# Patient Record
Sex: Male | Born: 1985 | Race: White | Hispanic: No | Marital: Single | Smoking: Current every day smoker
Health system: Southern US, Community
[De-identification: ages and names within clinical notes are randomized; demographics above are authoritative.]

## PROBLEM LIST (undated history)

## (undated) DIAGNOSIS — N451 Epididymitis: Secondary | ICD-10-CM

## (undated) DIAGNOSIS — I219 Acute myocardial infarction, unspecified: Secondary | ICD-10-CM

## (undated) DIAGNOSIS — Z8711 Personal history of peptic ulcer disease: Secondary | ICD-10-CM

## (undated) DIAGNOSIS — N200 Calculus of kidney: Secondary | ICD-10-CM

## (undated) DIAGNOSIS — Z8719 Personal history of other diseases of the digestive system: Secondary | ICD-10-CM

## (undated) DIAGNOSIS — I10 Essential (primary) hypertension: Secondary | ICD-10-CM

## (undated) DIAGNOSIS — F419 Anxiety disorder, unspecified: Secondary | ICD-10-CM

## (undated) DIAGNOSIS — J189 Pneumonia, unspecified organism: Secondary | ICD-10-CM

## (undated) HISTORY — DX: Essential (primary) hypertension: I10

## (undated) HISTORY — DX: Anxiety disorder, unspecified: F41.9

## (undated) HISTORY — DX: Calculus of kidney: N20.0

---

## 2005-06-02 ENCOUNTER — Ambulatory Visit (HOSPITAL_COMMUNITY): Admission: RE | Admit: 2005-06-02 | Discharge: 2005-06-02 | Payer: Self-pay | Admitting: Orthopedic Surgery

## 2005-07-28 ENCOUNTER — Encounter: Admission: RE | Admit: 2005-07-28 | Discharge: 2005-07-28 | Payer: Self-pay | Admitting: Family Medicine

## 2005-08-17 ENCOUNTER — Emergency Department (HOSPITAL_COMMUNITY): Admission: AC | Admit: 2005-08-17 | Discharge: 2005-08-18 | Payer: Self-pay

## 2006-02-08 ENCOUNTER — Emergency Department (HOSPITAL_COMMUNITY): Admission: EM | Admit: 2006-02-08 | Discharge: 2006-02-08 | Payer: Self-pay | Admitting: Emergency Medicine

## 2007-02-02 ENCOUNTER — Encounter: Admission: RE | Admit: 2007-02-02 | Discharge: 2007-02-02 | Payer: Self-pay | Admitting: Emergency Medicine

## 2007-03-13 ENCOUNTER — Encounter: Admission: RE | Admit: 2007-03-13 | Discharge: 2007-03-13 | Payer: Self-pay | Admitting: Family Medicine

## 2008-01-13 ENCOUNTER — Emergency Department (HOSPITAL_COMMUNITY): Admission: EM | Admit: 2008-01-13 | Discharge: 2008-01-14 | Payer: Self-pay | Admitting: Emergency Medicine

## 2008-04-23 ENCOUNTER — Emergency Department (HOSPITAL_COMMUNITY): Admission: EM | Admit: 2008-04-23 | Discharge: 2008-04-23 | Payer: Self-pay | Admitting: Emergency Medicine

## 2008-06-23 ENCOUNTER — Emergency Department (HOSPITAL_BASED_OUTPATIENT_CLINIC_OR_DEPARTMENT_OTHER): Admission: EM | Admit: 2008-06-23 | Discharge: 2008-06-23 | Payer: Self-pay | Admitting: Emergency Medicine

## 2008-08-14 ENCOUNTER — Emergency Department (HOSPITAL_BASED_OUTPATIENT_CLINIC_OR_DEPARTMENT_OTHER): Admission: EM | Admit: 2008-08-14 | Discharge: 2008-08-14 | Payer: Self-pay | Admitting: Emergency Medicine

## 2008-09-06 ENCOUNTER — Emergency Department (HOSPITAL_BASED_OUTPATIENT_CLINIC_OR_DEPARTMENT_OTHER): Admission: EM | Admit: 2008-09-06 | Discharge: 2008-09-06 | Payer: Self-pay | Admitting: Emergency Medicine

## 2008-09-06 ENCOUNTER — Ambulatory Visit: Payer: Self-pay | Admitting: Diagnostic Radiology

## 2008-10-19 ENCOUNTER — Emergency Department (HOSPITAL_BASED_OUTPATIENT_CLINIC_OR_DEPARTMENT_OTHER): Admission: EM | Admit: 2008-10-19 | Discharge: 2008-10-19 | Payer: Self-pay | Admitting: Emergency Medicine

## 2008-10-19 ENCOUNTER — Ambulatory Visit: Payer: Self-pay | Admitting: Diagnostic Radiology

## 2008-12-25 ENCOUNTER — Emergency Department (HOSPITAL_BASED_OUTPATIENT_CLINIC_OR_DEPARTMENT_OTHER): Admission: EM | Admit: 2008-12-25 | Discharge: 2008-12-25 | Payer: Self-pay | Admitting: Emergency Medicine

## 2008-12-30 ENCOUNTER — Emergency Department (HOSPITAL_BASED_OUTPATIENT_CLINIC_OR_DEPARTMENT_OTHER): Admission: EM | Admit: 2008-12-30 | Discharge: 2008-12-30 | Payer: Self-pay | Admitting: Emergency Medicine

## 2008-12-30 ENCOUNTER — Ambulatory Visit: Payer: Self-pay | Admitting: Diagnostic Radiology

## 2009-02-21 ENCOUNTER — Ambulatory Visit: Payer: Self-pay | Admitting: Radiology

## 2009-02-21 ENCOUNTER — Emergency Department (HOSPITAL_BASED_OUTPATIENT_CLINIC_OR_DEPARTMENT_OTHER): Admission: EM | Admit: 2009-02-21 | Discharge: 2009-02-21 | Payer: Self-pay | Admitting: Emergency Medicine

## 2009-03-08 ENCOUNTER — Ambulatory Visit: Payer: Self-pay | Admitting: Diagnostic Radiology

## 2009-03-08 ENCOUNTER — Emergency Department (HOSPITAL_BASED_OUTPATIENT_CLINIC_OR_DEPARTMENT_OTHER): Admission: EM | Admit: 2009-03-08 | Discharge: 2009-03-08 | Payer: Self-pay | Admitting: Emergency Medicine

## 2009-04-01 ENCOUNTER — Emergency Department (HOSPITAL_BASED_OUTPATIENT_CLINIC_OR_DEPARTMENT_OTHER): Admission: EM | Admit: 2009-04-01 | Discharge: 2009-04-01 | Payer: Self-pay | Admitting: Emergency Medicine

## 2009-04-10 ENCOUNTER — Emergency Department (HOSPITAL_BASED_OUTPATIENT_CLINIC_OR_DEPARTMENT_OTHER): Admission: EM | Admit: 2009-04-10 | Discharge: 2009-04-10 | Payer: Self-pay | Admitting: Emergency Medicine
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
## 2009-05-02 ENCOUNTER — Emergency Department (HOSPITAL_BASED_OUTPATIENT_CLINIC_OR_DEPARTMENT_OTHER): Admission: EM | Admit: 2009-05-02 | Discharge: 2009-05-02 | Payer: Self-pay | Admitting: Emergency Medicine

## 2009-05-20 ENCOUNTER — Encounter
Admission: RE | Admit: 2009-05-20 | Discharge: 2009-06-25 | Payer: Self-pay | Admitting: Physical Medicine and Rehabilitation

## 2009-10-03 HISTORY — PX: COLONOSCOPY: SHX174

## 2009-10-03 HISTORY — PX: LITHOTRIPSY: SUR834

## 2009-12-29 ENCOUNTER — Emergency Department (HOSPITAL_BASED_OUTPATIENT_CLINIC_OR_DEPARTMENT_OTHER): Admission: EM | Admit: 2009-12-29 | Discharge: 2009-12-29 | Payer: Self-pay | Admitting: Emergency Medicine

## 2009-12-29 ENCOUNTER — Ambulatory Visit: Payer: Self-pay | Admitting: Diagnostic Radiology

## 2010-01-02 ENCOUNTER — Ambulatory Visit: Payer: Self-pay | Admitting: Diagnostic Radiology

## 2010-01-02 ENCOUNTER — Emergency Department (HOSPITAL_BASED_OUTPATIENT_CLINIC_OR_DEPARTMENT_OTHER): Admission: EM | Admit: 2010-01-02 | Discharge: 2010-01-02 | Payer: Self-pay | Admitting: Emergency Medicine

## 2010-01-06 ENCOUNTER — Emergency Department (HOSPITAL_BASED_OUTPATIENT_CLINIC_OR_DEPARTMENT_OTHER): Admission: EM | Admit: 2010-01-06 | Discharge: 2010-01-06 | Payer: Self-pay | Admitting: Emergency Medicine

## 2010-02-24 ENCOUNTER — Ambulatory Visit: Payer: Self-pay | Admitting: Diagnostic Radiology

## 2010-02-24 ENCOUNTER — Emergency Department (HOSPITAL_BASED_OUTPATIENT_CLINIC_OR_DEPARTMENT_OTHER): Admission: EM | Admit: 2010-02-24 | Discharge: 2010-02-24 | Payer: Self-pay | Admitting: Emergency Medicine

## 2010-03-06 ENCOUNTER — Emergency Department (HOSPITAL_COMMUNITY): Admission: EM | Admit: 2010-03-06 | Discharge: 2010-03-06 | Payer: Self-pay | Admitting: Emergency Medicine

## 2010-05-07 ENCOUNTER — Emergency Department (HOSPITAL_COMMUNITY): Admission: EM | Admit: 2010-05-07 | Discharge: 2010-05-07 | Payer: Self-pay | Admitting: Emergency Medicine

## 2010-05-22 ENCOUNTER — Inpatient Hospital Stay (HOSPITAL_COMMUNITY): Admission: EM | Admit: 2010-05-22 | Discharge: 2010-05-25 | Payer: Self-pay | Admitting: Emergency Medicine

## 2010-12-11 ENCOUNTER — Emergency Department (HOSPITAL_COMMUNITY): Payer: PRIVATE HEALTH INSURANCE

## 2010-12-11 ENCOUNTER — Emergency Department (HOSPITAL_COMMUNITY)
Admission: EM | Admit: 2010-12-11 | Discharge: 2010-12-11 | Disposition: A | Discharge status: Home / Self Care | Payer: PRIVATE HEALTH INSURANCE | Attending: Emergency Medicine | Admitting: Emergency Medicine

## 2010-12-11 DIAGNOSIS — W1809XA Striking against other object with subsequent fall, initial encounter: Secondary | ICD-10-CM | POA: Insufficient documentation

## 2010-12-11 DIAGNOSIS — M549 Dorsalgia, unspecified: Secondary | ICD-10-CM | POA: Insufficient documentation

## 2010-12-11 DIAGNOSIS — Y9364 Activity, baseball: Secondary | ICD-10-CM | POA: Insufficient documentation

## 2010-12-11 DIAGNOSIS — R319 Hematuria, unspecified: Secondary | ICD-10-CM | POA: Insufficient documentation

## 2010-12-11 DIAGNOSIS — S93409A Sprain of unspecified ligament of unspecified ankle, initial encounter: Secondary | ICD-10-CM | POA: Insufficient documentation

## 2010-12-11 DIAGNOSIS — IMO0002 Reserved for concepts with insufficient information to code with codable children: Secondary | ICD-10-CM | POA: Insufficient documentation

## 2010-12-11 DIAGNOSIS — Y92838 Other recreation area as the place of occurrence of the external cause: Secondary | ICD-10-CM | POA: Insufficient documentation

## 2010-12-11 DIAGNOSIS — Y9239 Other specified sports and athletic area as the place of occurrence of the external cause: Secondary | ICD-10-CM | POA: Insufficient documentation

## 2010-12-11 LAB — URINE MICROSCOPIC-ADD ON

## 2010-12-11 LAB — URINALYSIS, ROUTINE W REFLEX MICROSCOPIC
Bilirubin Urine: NEGATIVE
Ketones, ur: NEGATIVE mg/dL
Protein, ur: >300 mg/dL — AB
Urobilinogen, UA: 1 mg/dL (ref 0.0–1.0)

## 2010-12-17 LAB — BASIC METABOLIC PANEL
BUN: 9 mg/dL (ref 6–23)
Calcium: 8.4 mg/dL (ref 8.4–10.5)
Creatinine, Ser: 0.92 mg/dL (ref 0.4–1.5)
GFR calc non Af Amer: >60 mL/min (ref >60)
Glucose, Bld: 82 mg/dL (ref 70–99)
Potassium: 3.9 mEq/L (ref 3.5–5.1)

## 2010-12-17 LAB — URINALYSIS, ROUTINE W REFLEX MICROSCOPIC
Bilirubin Urine: NEGATIVE
Glucose, UA: NEGATIVE mg/dL
Glucose, UA: NEGATIVE mg/dL
Ketones, ur: 40 mg/dL — AB
Leukocytes, UA: NEGATIVE
Nitrite: NEGATIVE
Nitrite: NEGATIVE
Specific Gravity, Urine: 1.026 (ref 1.005–1.030)
Urobilinogen, UA: 0.2 mg/dL (ref 0.0–1.0)
pH: 6 (ref 5.0–8.0)
pH: 6 (ref 5.0–8.0)

## 2010-12-17 LAB — LIPASE, BLOOD: Lipase: 25 U/L (ref 11–59)

## 2010-12-17 LAB — DIFFERENTIAL
Eosinophils Absolute: 0.1 10*3/uL (ref 0.0–0.7)
Eosinophils Relative: 0 % (ref 0–5)
Eosinophils Relative: 1 % (ref 0–5)
Lymphocytes Relative: 4 % — ABNORMAL LOW (ref 12–46)
Lymphs Abs: 0.8 10*3/uL (ref 0.7–4.0)
Lymphs Abs: 1.8 10*3/uL (ref 0.7–4.0)
Monocytes Absolute: 1 10*3/uL (ref 0.1–1.0)
Monocytes Absolute: 0.3 10*3/uL (ref 0.1–1.0)
Monocytes Relative: 4 % (ref 3–12)
Neutro Abs: 17.8 10*3/uL — ABNORMAL HIGH (ref 1.7–7.7)
Neutrophils Relative %: 74 % (ref 43–77)

## 2010-12-17 LAB — AMYLASE: Amylase: 39 U/L (ref 0–105)

## 2010-12-17 LAB — CBC
HCT: 34.5 % — ABNORMAL LOW (ref 39.0–52.0)
HCT: 45.5 % (ref 39.0–52.0)
Hemoglobin: 12.6 g/dL — ABNORMAL LOW (ref 13.0–17.0)
MCHC: 34.9 g/dL (ref 30.0–36.0)
MCHC: 35 g/dL (ref 30.0–36.0)
MCV: 93.6 fL (ref 78.0–100.0)
MCV: 94.5 fL (ref 78.0–100.0)
Platelets: 193 10*3/uL (ref 150–400)
Platelets: 212 10*3/uL (ref 150–400)
Platelets: 259 10*3/uL (ref 150–400)
Platelets: 266 10*3/uL (ref 150–400)
RBC: 3.88 MIL/uL — ABNORMAL LOW (ref 4.22–5.81)
RBC: 4.86 MIL/uL (ref 4.22–5.81)
RDW: 12.2 % (ref 11.5–15.5)
RDW: 12.8 % (ref 11.5–15.5)
WBC: 11.8 10*3/uL — ABNORMAL HIGH (ref 4.0–10.5)
WBC: 19.7 10*3/uL — ABNORMAL HIGH (ref 4.0–10.5)
WBC: 6.8 10*3/uL (ref 4.0–10.5)

## 2010-12-17 LAB — RAPID URINE DRUG SCREEN, HOSP PERFORMED
Amphetamines: NOT DETECTED
Cocaine: NOT DETECTED
Opiates: POSITIVE — AB
Tetrahydrocannabinol: POSITIVE — AB

## 2010-12-17 LAB — STOOL CULTURE

## 2010-12-17 LAB — COMPREHENSIVE METABOLIC PANEL
ALT: 15 U/L (ref 0–53)
AST: 23 U/L (ref 0–37)
Albumin: 3.9 g/dL (ref 3.5–5.2)
Calcium: 8.9 mg/dL (ref 8.4–10.5)
GFR calc Af Amer: >60 mL/min (ref >60)
Sodium: 135 mEq/L (ref 135–145)
Total Protein: 6.8 g/dL (ref 6.0–8.3)

## 2010-12-17 LAB — URINE CULTURE
Colony Count: NO GROWTH
Culture  Setup Time: 201108211323
Culture: NO GROWTH
Special Requests: NEGATIVE

## 2010-12-17 LAB — URINE MICROSCOPIC-ADD ON

## 2010-12-17 LAB — LACTIC ACID, PLASMA: Lactic Acid, Venous: 0.9 mmol/L (ref 0.5–2.2)

## 2010-12-17 LAB — POCT I-STAT, CHEM 8
BUN: 15 mg/dL (ref 6–23)
Chloride: 102 mEq/L (ref 96–112)
Creatinine, Ser: 0.9 mg/dL (ref 0.4–1.5)
Sodium: 136 mEq/L (ref 135–145)
TCO2: 26 mmol/L (ref 0–100)

## 2010-12-17 LAB — CLOSTRIDIUM DIFFICILE EIA: C difficile Toxins A+B, EIA: NEGATIVE

## 2010-12-17 LAB — HEPATIC FUNCTION PANEL
Albumin: 2.8 g/dL — ABNORMAL LOW (ref 3.5–5.2)
Total Bilirubin: 0.5 mg/dL (ref 0.3–1.2)

## 2010-12-20 LAB — POCT I-STAT, CHEM 8
BUN: 10 mg/dL (ref 6–23)
Creatinine, Ser: 0.8 mg/dL (ref 0.4–1.5)
Sodium: 142 mEq/L (ref 135–145)
TCO2: 27 mmol/L (ref 0–100)

## 2010-12-22 LAB — URINALYSIS, ROUTINE W REFLEX MICROSCOPIC
Glucose, UA: NEGATIVE mg/dL
Protein, ur: 30 mg/dL — AB

## 2010-12-22 LAB — URINE MICROSCOPIC-ADD ON

## 2010-12-26 LAB — URINALYSIS, ROUTINE W REFLEX MICROSCOPIC
Ketones, ur: 15 mg/dL — AB
Nitrite: NEGATIVE
Protein, ur: NEGATIVE mg/dL
Urobilinogen, UA: 1 mg/dL (ref 0.0–1.0)
pH: 6.5 (ref 5.0–8.0)

## 2011-01-27 ENCOUNTER — Ambulatory Visit (INDEPENDENT_AMBULATORY_CARE_PROVIDER_SITE_OTHER): Payer: PRIVATE HEALTH INSURANCE | Admitting: Family Medicine

## 2011-01-27 ENCOUNTER — Encounter: Payer: Self-pay | Admitting: Family Medicine

## 2011-01-27 DIAGNOSIS — F411 Generalized anxiety disorder: Secondary | ICD-10-CM

## 2011-01-27 DIAGNOSIS — Z136 Encounter for screening for cardiovascular disorders: Secondary | ICD-10-CM

## 2011-01-27 DIAGNOSIS — I10 Essential (primary) hypertension: Secondary | ICD-10-CM | POA: Insufficient documentation

## 2011-01-27 DIAGNOSIS — F419 Anxiety disorder, unspecified: Secondary | ICD-10-CM | POA: Insufficient documentation

## 2011-01-27 DIAGNOSIS — K5190 Ulcerative colitis, unspecified, without complications: Secondary | ICD-10-CM | POA: Insufficient documentation

## 2011-01-27 LAB — LIPID PANEL
Total CHOL/HDL Ratio: 2
VLDL: 14.8 mg/dL (ref 0.0–40.0)

## 2011-01-27 LAB — BASIC METABOLIC PANEL
BUN: 12 mg/dL (ref 6–23)
CO2: 29 mEq/L (ref 19–32)
Chloride: 104 mEq/L (ref 96–112)
Glucose, Bld: 71 mg/dL (ref 70–99)
Potassium: 4.6 mEq/L (ref 3.5–5.1)

## 2011-01-27 MED ORDER — MIRTAZAPINE 7.5 MG PO TABS: 7.5000 mg | ORAL_TABLET | Freq: Every day | ORAL | Status: AC

## 2011-01-27 MED ORDER — CLONIDINE HCL 0.1 MG PO TABS: 0.1000 mg | ORAL_TABLET | Freq: Every day | ORAL | Status: DC

## 2011-01-27 NOTE — Assessment & Plan Note (Signed)
Deteriorated. Will restart Clonidine. Check BMET today. Follow up in 1 month. Discussed importance of NOT discontinuing this medication.

## 2011-01-27 NOTE — Assessment & Plan Note (Signed)
Deteriorated. Will add Remeron 7.5 mg nightly. Follow up in one month.

## 2011-01-27 NOTE — Progress Notes (Signed)
25 yo here male here to establish care.  HTN- was on clonidine for years but lost his insurance.  Clonidine was chosen because it helped with his anxiety and OCD symptoms. Per pt, was on on clonidine .1 mg twice daily but became hypotensive.   Pt denies any CP, SOB, or blurred vision. Pos FH of HTN in mother, does not know his father.  Anxiety/depression- in a custody battle with his ex girlfriend for their 76 year old daughter. Has to pretend to be happy at work but at home becomes tearful when he thinks of his daughter, has hard time falling asleep because mind is racing. Has lost 5 pounds because of decreased appetite. No panic attacks. No SI or HI. In past was on Zoloft and Celexa which made him feel "like a Zombie." Does have a therapist that he sees on occasion.  The PMH, PSH, Social History, Family History, Medications, and allergies have been reviewed in [facility], and have been updated if relevant.  ROS: As per HPI General: Denies fever, chills, sweats. Pos weight loss. Eyes: Denies blurring,significant itching ENT: Denies earache, sore throat, and hoarseness.  Cardiovascular: Denies chest pains, palpitations, dyspnea on exertion,  Respiratory: Denies cough, dyspnea at rest,wheeezing GI: Denies nausea, vomiting, diarrhea, constipation, change in bowel habits, abdominal pain, melena, hematochezia GU: Denies dysuria, hematuria, urinary hesitancy, nocturia, denies STD risk, no concerns about discharge Musculoskeletal: Denies back pain, joint pain Derm: Denies rash, itching Neuro: Denies  paresthesias, frequent falls, frequent headaches Psych: Denies SI or HI. Endocrine: Denies cold intolerance, heat intolerance, polydipsia Heme: Denies enlarged lymph nodes Allergy: No hayfever  Physical exam: BP 140/80  Pulse 88  Temp(Src) 98.2 F (36.8 C) (Oral)  Ht 5' 7"  (1.702 m)  Wt 115 lb 1.9 oz (52.218 kg)  BMI 18.03 kg/m2 General:  Alert, thin male, NAD. Eyes:  PERRL Ears:   External ear exam shows no significant lesions or deformities.  Otoscopic examination reveals clear canals, tympanic membranes are intact bilaterally without bulging, retraction, inflammation or discharge. Hearing is grossly normal bilaterally. Nose:  External nasal examination shows no deformity or inflammation. Nasal mucosa are pink and moist without lesions or exudates. Mouth:  Oral mucosa and oropharynx without lesions or exudates.  Teeth in good repair. Neck:  no carotid bruit or thyromegaly no cervical or supraclavicular lymphadenopathy  Lungs:  Normal respiratory effort, chest expands symmetrically. Lungs are clear to auscultation, no crackles or wheezes. Heart:  Normal rate and regular rhythm. S1 and S2 normal without gallop, murmur, click, rub or other extra sounds. Abdomen:  Bowel sounds positive,abdomen soft and non-tender without masses, organomegaly or hernias noted. Pulses:  R and L posterior tibial pulses are full and equal bilaterally  Extremities:  no edema  Psych:  Good eye contact, appropriate but moderately anxious

## 2011-01-27 NOTE — Patient Instructions (Signed)
Great to meet you. Please make an appointment to come see me in one month. 

## 2011-02-15 ENCOUNTER — Encounter: Payer: Self-pay | Admitting: Family Medicine

## 2011-03-01 ENCOUNTER — Ambulatory Visit (INDEPENDENT_AMBULATORY_CARE_PROVIDER_SITE_OTHER): Payer: PRIVATE HEALTH INSURANCE | Admitting: Family Medicine

## 2011-03-01 VITALS — BP 110/80 | HR 77 | Temp 98.6°F | Wt 116.0 lb

## 2011-03-01 DIAGNOSIS — F411 Generalized anxiety disorder: Secondary | ICD-10-CM

## 2011-03-01 DIAGNOSIS — F419 Anxiety disorder, unspecified: Secondary | ICD-10-CM

## 2011-03-01 DIAGNOSIS — I10 Essential (primary) hypertension: Secondary | ICD-10-CM

## 2011-03-01 MED ORDER — BUSPIRONE HCL 15 MG PO TABS: 15.0000 mg | ORAL_TABLET | Freq: Two times a day (BID) | ORAL | Status: DC

## 2011-03-01 NOTE — Assessment & Plan Note (Signed)
Improved. Continue clonidine.

## 2011-03-01 NOTE — Assessment & Plan Note (Signed)
Deteriorated. >25 min spent with face to face with patient counseling and coordinating care. Will d/c remeron and start buspar 15 mg twice a day. Given weaning schedule for remeron. Follow up in one month.

## 2011-03-01 NOTE — Progress Notes (Signed)
25 yo here male here for one month follow up.  HTN- was on clonidine for years but lost his insurance.  Clonidine was chosen because it helped with his anxiety and OCD symptoms. Per pt, was on on clonidine .1 mg twice daily but became hypotensive.   Pt denies any CP, SOB, or blurred vision. Pos FH of HTN in mother, does not know his father. Restart Clonidine last month.  BP much improved and he feels better.    Anxiety/depression- in a custody battle with his ex girlfriend for their four year old daughter, court case in on June 7th.   Has to pretend to be happy at work but at home becomes tearful when he thinks of his daughter, has hard time falling asleep because mind is racing. No panic attacks. No SI or HI. In past was on Zoloft, Paxil and Celexa which made him feel "like a Zombie." Does have a therapist that he sees on occasion. Started Remeron 7.5 mg nightly last month.  Improved his symptoms but gave him very vivid dreams.    The PMH, PSH, Social History, Family History, Medications, and allergies have been reviewed in [facility], and have been updated if relevant.  ROS: As per HPI General: Denies fever, chills, sweats.  Eyes: Denies blurring,significant itching Cardiovascular: Denies chest pains, palpitations, dyspnea on exertion,  Respiratory: Denies cough, dyspnea at rest,wheeezing GI: Denies nausea, vomiting, diarrhea, constipation, change in bowel habits, abdominal pain, melena, hematochezia Psych: Denies SI or HI. Endocrine: Denies cold intolerance, heat intolerance, polydipsia Heme: Denies enlarged lymph nodes Allergy: No hayfever  Physical exam: BP 110/80  Pulse 77  Temp 98.6 F (37 C)  Wt 116 lb (52.617 kg) Wt Readings from Last 3 Encounters:  03/01/11 116 lb (52.617 kg)  01/27/11 115 lb 1.9 oz (52.218 kg)   General:  Alert, thin male, NAD. Eyes:  PERRL Ears:  External ear exam shows no significant lesions or deformities.  Otoscopic examination reveals clear canals,  tympanic membranes are intact bilaterally without bulging, retraction, inflammation or discharge. Hearing is grossly normal bilaterally. Nose:  External nasal examination shows no deformity or inflammation. Nasal mucosa are pink and moist without lesions or exudates. Mouth:  Oral mucosa and oropharynx without lesions or exudates.  Teeth in good repair. Neck:  no carotid bruit or thyromegaly no cervical or supraclavicular lymphadenopathy  Lungs:  Normal respiratory effort, chest expands symmetrically. Lungs are clear to auscultation, no crackles or wheezes. Heart:  Normal rate and regular rhythm. S1 and S2 normal without gallop, murmur, click, rub or other extra sounds. Abdomen:  Bowel sounds positive,abdomen soft and non-tender without masses, organomegaly or hernias noted. Pulses:  R and L posterior tibial pulses are full and equal bilaterally  Extremities:  no edema  Psych:  Good eye contact, appropriate but moderately anxious

## 2011-06-28 LAB — CBC
HCT: 40.6
Platelets: 259
RDW: 12.3
WBC: 9

## 2011-06-28 LAB — BASIC METABOLIC PANEL
Chloride: 103
GFR calc Af Amer: >60
GFR calc non Af Amer: >60
Potassium: 3.6
Sodium: 138

## 2011-07-05 LAB — CBC
HCT: 41
MCV: 92.9
Platelets: 356
RBC: 4.42
WBC: 11.5 — ABNORMAL HIGH

## 2011-07-05 LAB — DIFFERENTIAL
Eosinophils Relative: 2
Lymphocytes Relative: 19
Monocytes Absolute: 0.7
Monocytes Relative: 6
Neutro Abs: 8.4 — ABNORMAL HIGH

## 2011-07-05 LAB — BASIC METABOLIC PANEL
BUN: 9
Chloride: 100
GFR calc Af Amer: >60
GFR calc non Af Amer: >60
Potassium: 3.5
Sodium: 141

## 2011-07-05 LAB — POCT TOXICOLOGY PANEL: Amphetamines: POSITIVE

## 2011-07-05 LAB — ETHANOL: Alcohol, Ethyl (B): <10

## 2011-07-05 LAB — POCT CARDIAC MARKERS
CKMB, poc: 1.2
CKMB, poc: <1 — ABNORMAL LOW
Troponin i, poc: <0.05
Troponin i, poc: <0.05

## 2011-07-08 LAB — DIFFERENTIAL
Eosinophils Relative: 3 % (ref 0–5)
Lymphocytes Relative: 29 % (ref 12–46)
Lymphs Abs: 2.7 10*3/uL (ref 0.7–4.0)

## 2011-07-08 LAB — BASIC METABOLIC PANEL
BUN: 15 mg/dL (ref 6–23)
GFR calc Af Amer: >60 mL/min (ref >60)
GFR calc non Af Amer: >60 mL/min (ref >60)
Potassium: 4 mEq/L (ref 3.5–5.1)
Sodium: 142 mEq/L (ref 135–145)

## 2011-07-08 LAB — CBC
HCT: 40.4 % (ref 39.0–52.0)
Platelets: 269 10*3/uL (ref 150–400)
RBC: 4.34 MIL/uL (ref 4.22–5.81)
WBC: 9.5 10*3/uL (ref 4.0–10.5)

## 2011-12-23 ENCOUNTER — Telehealth: Payer: Self-pay | Admitting: Family Medicine

## 2011-12-23 NOTE — Telephone Encounter (Signed)
Agreed -

## 2011-12-23 NOTE — Telephone Encounter (Signed)
Triage Record Num: [id number] Operator: Trinna Balloon Patient Name: Todd Hanna Call Date & Time: 12/23/2011 12:20:45PM Patient Phone: [phone] PCP: Arnette Norris Patient Gender: Male PCP Fax : [phone] Patient DOB: Jan 05, 1986 Practice Name: [facility] Reason for Call: Caller: [place]/Patient; PCP: Arnette Norris [facility]); CB#: [phone]; Call regarding L elbow swelling; L elbow swelling and redness, hot to touch onset 12/22/11. Random tingling to fingers. No known injury. Advised UC now per Elbow Non-Injury Protocol. Protocol(s) Used: Elbow Non-Injury Recommended Outcome per Protocol: See ED Immediately Reason for Outcome: New onset of severe pain AND change in sensation (numb, tingling, or no feeling), change in color (pale or blue), feels cool to touch compared to other extremity. Care Advice: ~ Another adult should drive. ~ Remove any rings on the fingers of the affected hand, if possible. ~ Continue to elevate affected part until evaluated by provider. If extremity has not been elevated, elevate the extremity above the level of the heart for 30 minutes. Go to the ED immediately if the swelling has not improved and there are still symptoms of pain, numbness, tingling, coolness or change in color. ~ 12/23/2011 12:31:29PM Page 1 of 1 [id number]

## 2011-12-23 NOTE — Telephone Encounter (Signed)
Caller: Todd Hanna/Patient; PCP: Arnette Norris (Crissie Sickles); CB#: [phone];  Call regarding L elbow swelling; L elbow swelling and redness, hot to touch onset 12/22/11. Random tingling to fingers. No known injury. Advised UC now per Elbow Non-Injury Protocol.

## 2011-12-30 ENCOUNTER — Other Ambulatory Visit: Payer: Self-pay | Admitting: Family Medicine

## 2012-01-31 ENCOUNTER — Other Ambulatory Visit: Payer: Self-pay | Admitting: Family Medicine

## 2012-02-29 ENCOUNTER — Other Ambulatory Visit: Payer: Self-pay | Admitting: Family Medicine
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
## 2012-03-30 ENCOUNTER — Encounter (HOSPITAL_COMMUNITY): Payer: Self-pay | Admitting: Emergency Medicine

## 2012-03-30 ENCOUNTER — Emergency Department (HOSPITAL_COMMUNITY)
Admission: EM | Admit: 2012-03-30 | Discharge: 2012-03-30 | Disposition: A | Discharge status: Home / Self Care | Payer: Self-pay | Attending: Emergency Medicine | Admitting: Emergency Medicine

## 2012-03-30 ENCOUNTER — Emergency Department (HOSPITAL_COMMUNITY): Payer: Self-pay

## 2012-03-30 DIAGNOSIS — W06XXXA Fall from bed, initial encounter: Secondary | ICD-10-CM | POA: Insufficient documentation

## 2012-03-30 DIAGNOSIS — Z880 Allergy status to penicillin: Secondary | ICD-10-CM | POA: Insufficient documentation

## 2012-03-30 DIAGNOSIS — Z79899 Other long term (current) drug therapy: Secondary | ICD-10-CM | POA: Insufficient documentation

## 2012-03-30 DIAGNOSIS — S93409A Sprain of unspecified ligament of unspecified ankle, initial encounter: Secondary | ICD-10-CM | POA: Insufficient documentation

## 2012-03-30 DIAGNOSIS — Z87442 Personal history of urinary calculi: Secondary | ICD-10-CM | POA: Insufficient documentation

## 2012-03-30 DIAGNOSIS — Y92009 Unspecified place in unspecified non-institutional (private) residence as the place of occurrence of the external cause: Secondary | ICD-10-CM | POA: Insufficient documentation

## 2012-03-30 DIAGNOSIS — S93402A Sprain of unspecified ligament of left ankle, initial encounter: Secondary | ICD-10-CM

## 2012-03-30 DIAGNOSIS — I10 Essential (primary) hypertension: Secondary | ICD-10-CM | POA: Insufficient documentation

## 2012-03-30 DIAGNOSIS — F17200 Nicotine dependence, unspecified, uncomplicated: Secondary | ICD-10-CM | POA: Insufficient documentation

## 2012-03-30 MED ORDER — TRAMADOL HCL 50 MG PO TABS: 50.0000 mg | ORAL_TABLET | Freq: Four times a day (QID) | ORAL | Status: AC | PRN

## 2012-03-30 NOTE — ED Notes (Signed)
C/o L ankle pain x 2 hours.  Pt states he twisted L ankle when he got off bed tonight.

## 2012-03-30 NOTE — Discharge Instructions (Signed)
Ankle Sprain An ankle sprain is an injury to the strong, fibrous tissues (ligaments) that hold the bones of your ankle joint together.  CAUSES Ankle sprain usually is caused by a fall or by twisting your ankle. People who participate in sports are more prone to these types of injuries.  SYMPTOMS  Symptoms of ankle sprain include:  Pain in your ankle. The pain may be present at rest or only when you are trying to stand or walk.   Swelling.   Bruising. Bruising may develop immediately or within 1 to 2 days after your injury.   Difficulty standing or walking.  DIAGNOSIS  Your caregiver will ask you details about your injury and perform a physical exam of your ankle to determine if you have an ankle sprain. During the physical exam, your caregiver will press and squeeze specific areas of your foot and ankle. Your caregiver will try to move your ankle in certain ways. An X-ray exam may be done to be sure a bone was not broken or a ligament did not separate from one of the bones in your ankle (avulsion).  TREATMENT  Certain types of braces can help stabilize your ankle. Your caregiver can make a recommendation for this. Your caregiver may recommend the use of medication for pain. If your sprain is severe, your caregiver may refer you to a surgeon who helps to restore function to parts of your skeletal system (orthopedist) or a physical therapist. [place] ice to your injury for 1 to 2 days or as directed by your caregiver. Applying ice helps to reduce inflammation and pain.  Put ice in a plastic bag.   Place a towel between your skin and the bag.   Leave the ice on for 15 to 20 minutes at a time, every 2 hours while you are awake.   Take over-the-counter or prescription medicines for pain, discomfort, or fever only as directed by your caregiver.   Keep your injured leg elevated, when possible, to lessen swelling.   If your caregiver recommends crutches, use them as  instructed. Gradually, put weight on the affected ankle. Continue to use crutches or a cane until you can walk without feeling pain in your ankle.   If you have a plaster splint, wear the splint as directed by your caregiver. Do not rest it on anything harder than a pillow the first 24 hours. Do not put weight on it. Do not get it wet. You may take it off to take a shower or bath.   You may have been given an elastic bandage to wear around your ankle to provide support. If the elastic bandage is too tight (you have numbness or tingling in your foot or your foot becomes cold and blue), adjust the bandage to make it comfortable.   If you have an air splint, you may blow more air into it or let air out to make it more comfortable. You may take your splint off at night and before taking a shower or bath.   Wiggle your toes in the splint several times per day if you are able.  SEEK MEDICAL CARE IF:   You have an increase in bruising, swelling, or pain.   Your toes feel cold.   Pain relief is not achieved with medication.  SEEK IMMEDIATE MEDICAL CARE IF: Your toes are numb or blue or you have severe pain. MAKE SURE YOU:   Understand these instructions.   Will watch your condition.  Will get help right away if you are not doing well or get worse.  Document Released: 09/19/2005 Document Revised: 09/08/2011 Document Reviewed: 04/23/2008 [facility] Patient Information 2012 [place].Ligament Sprain Ligaments are tough, fibrous tissues that hold bones together at the joints. A sprain can occur when a ligament is stretched. This injury may take several weeks to heal. [place] the injured area for as long as directed by your caregiver. Then slowly start using the joint as directed by your caregiver and as the pain allows.   Keep the affected joint raised if possible to lessen swelling.   Apply ice for 15 to 20 minutes to the injured area every couple hours for the first  half day, then 3 to 4 times per day for the first 48 hours. Put the ice in a plastic bag and place a towel between the bag of ice and your skin.   Wear any splinting, casting, or elastic bandage applications as instructed.   Only take over-the-counter or prescription medicines for pain, discomfort, or fever as directed by your caregiver. Do not use aspirin immediately after the injury unless instructed by your caregiver. Aspirin can cause increased bleeding and bruising of the tissues.   If you were given crutches, continue to use them as instructed and do not resume weight bearing on the affected extremity until instructed.  SEEK MEDICAL CARE IF:   Your bruising, swelling, or pain increases.   You have cold and numb fingers or toes if your arm or leg was injured.  SEEK IMMEDIATE MEDICAL CARE IF:   Your toes are numb or blue if your leg was injured.   Your fingers are numb or blue if your arm was injured.   Your pain is not responding to medicines and continues to stay the same or gets worse.  MAKE SURE YOU:   Understand these instructions.   Will watch your condition.   Will get help right away if you are not doing well or get worse.  Document Released: 09/16/2000 Document Revised: 09/08/2011 Document Reviewed: 07/15/2008 [facility] Patient Information 2012 [place].

## 2012-03-30 NOTE — ED Provider Notes (Signed)
Medical screening examination/treatment/procedure(s) were performed by non-physician practitioner and as supervising physician I was immediately available for consultation/collaboration.   Teressa Lower, MD 03/30/12 [phone]
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
## 2012-04-17 ENCOUNTER — Other Ambulatory Visit: Payer: Self-pay | Admitting: Family Medicine

## 2012-05-07 ENCOUNTER — Other Ambulatory Visit: Payer: Self-pay | Admitting: Family Medicine

## 2012-05-09 ENCOUNTER — Emergency Department: Payer: Self-pay | Admitting: Emergency Medicine

## 2012-05-23 ENCOUNTER — Other Ambulatory Visit: Payer: Self-pay | Admitting: Family Medicine

## 2012-06-15 ENCOUNTER — Encounter: Payer: Self-pay | Admitting: Family Medicine

## 2012-06-15 ENCOUNTER — Ambulatory Visit (INDEPENDENT_AMBULATORY_CARE_PROVIDER_SITE_OTHER): Payer: PRIVATE HEALTH INSURANCE | Admitting: Family Medicine

## 2012-06-15 ENCOUNTER — Other Ambulatory Visit: Payer: Self-pay | Admitting: Family Medicine

## 2012-06-15 VITALS — BP 120/80 | HR 80 | Temp 97.8°F | Wt 121.0 lb

## 2012-06-15 DIAGNOSIS — I10 Essential (primary) hypertension: Secondary | ICD-10-CM

## 2012-06-15 DIAGNOSIS — F419 Anxiety disorder, unspecified: Secondary | ICD-10-CM

## 2012-06-15 MED ORDER — CLONIDINE HCL 0.1 MG PO TABS: ORAL_TABLET | ORAL | Status: DC

## 2012-06-15 NOTE — Telephone Encounter (Signed)
Dr. [name], pt not seen since 02/2011.  He was given 15 pills last refill with instructions to tell him he needs to be seen. Neither phone numbers are good.  Ok to refill?

## 2012-06-15 NOTE — Telephone Encounter (Signed)
Ok to refill only 15 pills- must be seen for further refills. Thanks.

## 2012-06-15 NOTE — Progress Notes (Signed)
26 yo here for follow up.    HTN- on Clonidine. Clonidine was chosen because it helped with his anxiety and OCD symptoms. Pt denies any CP, SOB, or blurred vision. Overall feels very good.  Anxiety/depression- In past was on Zoloft, Paxil and Celexa which made him feel "like a Zombie." Does have a therapist that he sees on occasion. Most recently, we tried remeron which caused very vivid dreams.   He feels he is in a good place off medication.  Patient Active Problem List  Diagnosis  . Hypertension  . Anxiety  . Ulcerative colitis   Past Medical History  Diagnosis Date  . Hypertension   . Anxiety   . Ulcerative colitis 2011    colonoscopy at [facility]  . Nephrolithiasis    Past Surgical History  Procedure Date  . Lithotripsy 2011   History  Substance Use Topics  . Smoking status: Current Every Day Smoker  . Smokeless tobacco: Not on file  . Alcohol Use: Yes   Family History  Problem Relation Age of Onset  . Hypertension Mother    Allergies  Allergen Reactions  . Amoxicillin Hives  . Penicillins Hives   Current Outpatient Prescriptions on File Prior to Visit  Medication Sig Dispense Refill  . ibuprofen (ADVIL,MOTRIN) 800 MG tablet Take 800 mg by mouth every 8 (eight) hours as needed. For pain      . DISCONTD: cloNIDine (CATAPRES) 0.1 MG tablet TAKE 1 TABLET BY MOUTH EVERY DAY  15 tablet  0  . DISCONTD: cloNIDine (CATAPRES) 0.1 MG tablet TAKE 1 TABLET BY MOUTH EVERY DAY  30 tablet  0  . DISCONTD: cloNIDine (CATAPRES) 0.1 MG tablet TAKE 1 TABLET BY MOUTH EVERY DAY  15 tablet  0     The PMH, PSH, Social History, Family History, Medications, and allergies have been reviewed in [facility], and have been updated if relevant.  ROS: As per HPI General: Denies fever, chills, sweats.  Eyes: Denies blurring,significant itching Cardiovascular: Denies chest pains, palpitations, dyspnea on exertion,  Respiratory: Denies cough, dyspnea at rest,wheeezing GI: Denies nausea, vomiting,  diarrhea, constipation, change in bowel habits, abdominal pain, melena, hematochezia Psych: Denies SI or HI. Endocrine: Denies cold intolerance, heat intolerance, polydipsia Heme: Denies enlarged lymph nodes Allergy: No hayfever  Physical exam: BP 120/80  Pulse 80  Temp 97.8 F (36.6 C)  Wt 121 lb (54.885 kg)  General:  Alert, thin male, NAD. Eyes:  PERRL Ears:  External ear exam shows no significant lesions or deformities.  Otoscopic examination reveals clear canals, tympanic membranes are intact bilaterally without bulging, retraction, inflammation or discharge. Hearing is grossly normal bilaterally. Nose:  External nasal examination shows no deformity or inflammation. Nasal mucosa are pink and moist without lesions or exudates. Mouth:  Oral mucosa and oropharynx without lesions or exudates.  Teeth in good repair. Neck:  no carotid bruit or thyromegaly no cervical or supraclavicular lymphadenopathy  Lungs:  Normal respiratory effort, chest expands symmetrically. Lungs are clear to auscultation, no crackles or wheezes. Heart:  Normal rate and regular rhythm. S1 and S2 normal without gallop, murmur, click, rub or other extra sounds. Abdomen:  Bowel sounds positive,abdomen soft and non-tender without masses, organomegaly or hernias noted. Pulses:  R and L posterior tibial pulses are full and equal bilaterally  Extremities:  no edema  Psych:  Good eye contact, appropriate less anxious today.  Assessment and Plan:  1. Hypertension    Stable.  Clonidine refilled. Follow up as needed. Pt declined further  preventative care today since he does not have insurance.

## 2013-01-19 ENCOUNTER — Emergency Department: Payer: Self-pay | Admitting: Emergency Medicine

## 2013-06-27 ENCOUNTER — Emergency Department (HOSPITAL_COMMUNITY): Payer: PRIVATE HEALTH INSURANCE

## 2013-06-27 ENCOUNTER — Emergency Department (HOSPITAL_COMMUNITY)
Admission: EM | Admit: 2013-06-27 | Discharge: 2013-06-27 | Disposition: A | Discharge status: Home / Self Care | Payer: PRIVATE HEALTH INSURANCE | Attending: Emergency Medicine | Admitting: Emergency Medicine

## 2013-06-27 ENCOUNTER — Encounter (HOSPITAL_COMMUNITY): Payer: Self-pay

## 2013-06-27 DIAGNOSIS — Y9289 Other specified places as the place of occurrence of the external cause: Secondary | ICD-10-CM | POA: Insufficient documentation

## 2013-06-27 DIAGNOSIS — I10 Essential (primary) hypertension: Secondary | ICD-10-CM | POA: Insufficient documentation

## 2013-06-27 DIAGNOSIS — F411 Generalized anxiety disorder: Secondary | ICD-10-CM | POA: Insufficient documentation

## 2013-06-27 DIAGNOSIS — Z8719 Personal history of other diseases of the digestive system: Secondary | ICD-10-CM | POA: Insufficient documentation

## 2013-06-27 DIAGNOSIS — S6000XA Contusion of unspecified finger without damage to nail, initial encounter: Secondary | ICD-10-CM | POA: Insufficient documentation

## 2013-06-27 DIAGNOSIS — F17200 Nicotine dependence, unspecified, uncomplicated: Secondary | ICD-10-CM | POA: Insufficient documentation

## 2013-06-27 DIAGNOSIS — R209 Unspecified disturbances of skin sensation: Secondary | ICD-10-CM | POA: Insufficient documentation

## 2013-06-27 DIAGNOSIS — Z880 Allergy status to penicillin: Secondary | ICD-10-CM | POA: Insufficient documentation

## 2013-06-27 DIAGNOSIS — W230XXA Caught, crushed, jammed, or pinched between moving objects, initial encounter: Secondary | ICD-10-CM | POA: Insufficient documentation

## 2013-06-27 DIAGNOSIS — Y939 Activity, unspecified: Secondary | ICD-10-CM | POA: Insufficient documentation

## 2013-06-27 DIAGNOSIS — T148XXA Other injury of unspecified body region, initial encounter: Secondary | ICD-10-CM

## 2013-06-27 DIAGNOSIS — Z87442 Personal history of urinary calculi: Secondary | ICD-10-CM | POA: Insufficient documentation

## 2013-06-27 MED ORDER — TRAMADOL HCL 50 MG PO TABS
50.0000 mg | ORAL_TABLET | Freq: Once | ORAL | Status: AC
  Administered 2013-06-27: 50 mg via ORAL
  Filled 2013-06-27: qty 1

## 2013-06-27 MED ORDER — TRAMADOL HCL 50 MG PO TABS: 50.0000 mg | ORAL_TABLET | Freq: Four times a day (QID) | ORAL | Status: DC | PRN

## 2013-06-27 NOTE — ED Provider Notes (Signed)
CSN: [id number]     Arrival date & time 06/27/13  1350 History   First MD Initiated Contact with Patient 06/27/13 1613     Chief Complaint  Patient presents with  . Finger Injury   (Consider location/radiation/quality/duration/timing/severity/associated sxs/prior Treatment) HPI Comments: Patient is a 27 year old male with history of hypertension, anxiety, ulcerative colitis, nephrolithiasis who presents today after getting his left hand caught in the door of a [employer]. This happened approximately 5 hours ago. He took Motrin with no relief of his pain. He reports the pain as a pressure and "like a balloon that's inflated". He also tried to ice his hand at home. Movement and palpation make his pain worse. He has some associated tingling at the tip of his second and third finger on his left hand. He is left-hand dominant. His tetanus shot is up to date. No fevers, chills, nausea, vomiting, abdominal pain, shortness of breath, chest pain, paresthesias.  The history is provided by the patient. No language interpreter was used.    Past Medical History  Diagnosis Date  . Hypertension   . Anxiety   . Ulcerative colitis 2011    colonoscopy at [facility]  . Nephrolithiasis    Past Surgical History  Procedure Laterality Date  . Lithotripsy  2011   Family History  Problem Relation Age of Onset  . Hypertension Mother    History  Substance Use Topics  . Smoking status: Current Every Day Smoker -- 0.50 packs/day    Types: Cigarettes  . Smokeless tobacco: Never Used  . Alcohol Use: Yes    Review of Systems  Constitutional: Negative for fever and chills.  Respiratory: Negative for shortness of breath.   Cardiovascular: Negative for chest pain.  Gastrointestinal: Negative for nausea, vomiting and abdominal pain.  Musculoskeletal: Positive for myalgias, joint swelling and arthralgias.  All other systems reviewed and are negative.    Allergies  Amoxicillin and Penicillins  Home  Medications   Current Outpatient Rx  Name  Route  Sig  Dispense  Refill  . cloNIDine (CATAPRES) 0.1 MG tablet      TAKE 1 TABLET BY MOUTH EVERY DAY   30 tablet   11   . ibuprofen (ADVIL,MOTRIN) 800 MG tablet   Oral   Take 800 mg by mouth every 8 (eight) hours as needed. For pain          BP 136/74  Pulse 97  Temp(Src) 98.1 F (36.7 C)  Resp 16  SpO2 99% Physical Exam  Nursing note and vitals reviewed. Constitutional: He is oriented to person, place, and time. He appears well-developed and well-nourished. No distress.  HENT:  Head: Normocephalic and atraumatic.  Right Ear: External ear normal.  Left Ear: External ear normal.  Nose: Nose normal.  Eyes: Conjunctivae are normal.  Neck: Normal range of motion. No tracheal deviation present.  Cardiovascular: Normal rate, regular rhythm and normal heart sounds.   Pulmonary/Chest: Effort normal and breath sounds normal. No stridor.  Abdominal: Soft. He exhibits no distension. There is no tenderness.  Musculoskeletal: Normal range of motion.  Cap refill is 3 seconds in the tips of all fingers on left. Bruising and swelling to second and third digits on left hand concentrated around PIP. Flexion and extension intact, but range of motion is limited due to pain. Compartment soft.   Neurological: He is alert and oriented to person, place, and time.  Neurovascularly intact.  Skin: Skin is warm and dry. He is not diaphoretic.  Small abrasion  over her left 2nd PIP  Psychiatric: He has a normal mood and affect. His behavior is normal.    ED Course  Procedures (including critical care time) Labs Review Labs Reviewed - No data to display Imaging Review Dg Hand Complete Left  06/27/2013   CLINICAL DATA:  Hand injury  EXAM: LEFT HAND - COMPLETE 3+ VIEW  COMPARISON:  02/24/2010  FINDINGS: Three views of the left hand submitted. No acute fracture or subluxation. No radiopaque foreign body.  IMPRESSION: No acute fracture or subluxation.    Electronically Signed   By: Natasha Mead   On: 06/27/2013 15:10    MDM   1. Contusion    Patient presents after hitting his hand caught in a door. It was his left hand and he is left-hand dominant. X-ray shows no fracture. There is some bruising on his second and third finger. Range of motion is limited due to pain. He is neurovascularly intact and the compartments are soft. Discussed rest, ice, elevation, NSAIDs. He was given tramadol. He was given a hand surgery referral for any worsening of his symptoms. Strict return instructions given. Vital signs stable for discharge.    Mora Bellman, PA-C 06/27/13 1642

## 2013-06-27 NOTE — ED Provider Notes (Signed)
Medical screening examination/treatment/procedure(s) were performed by non-physician practitioner and as supervising physician I was immediately available for consultation/collaboration.   Richardean Canal, MD 06/27/13 [phone]

## 2013-06-27 NOTE — ED Notes (Signed)
Pt c/o L finger pain, after closing them in a car door.  Pain score 6/10.  Bruising, swelling and skin tear noted to L index and middle finger.

## 2013-06-28 ENCOUNTER — Encounter: Payer: Self-pay | Admitting: Family Medicine

## 2013-06-28 ENCOUNTER — Ambulatory Visit (INDEPENDENT_AMBULATORY_CARE_PROVIDER_SITE_OTHER): Payer: PRIVATE HEALTH INSURANCE | Admitting: Family Medicine

## 2013-06-28 VITALS — BP 122/76 | HR 106 | Temp 97.8°F | Wt 113.8 lb

## 2013-06-28 DIAGNOSIS — M79609 Pain in unspecified limb: Secondary | ICD-10-CM

## 2013-06-28 DIAGNOSIS — M79642 Pain in left hand: Secondary | ICD-10-CM | POA: Insufficient documentation

## 2013-06-28 MED ORDER — OXYCODONE-ACETAMINOPHEN 10-325 MG PO TABS: 1.0000 | ORAL_TABLET | Freq: Three times a day (TID) | ORAL | Status: DC | PRN

## 2013-06-28 NOTE — Patient Instructions (Addendum)
Continue Ibuprofen 800 mg three times daily at least for next three days- with food. Take Percocet as needed (as directed).  If your swelling worsens over the weekend, go straight to the ER.  Please call hand surgeon if your symptoms worsen.

## 2013-06-28 NOTE — Progress Notes (Signed)
Subjective:    Patient ID: Todd Hanna, male    DOB: [birth year], 27 y.o.   MRN: [id number]  HPI  Very pleasant 27 yo male here for ER follow up.  Was seen in ER after he slammed his left hand in door.  Notes reviewed.  Left hand Xray was negative.  Advised NSAIDs and follow up with hand surgeon if symptoms worsen. Dg Hand Complete Left  06/27/2013   CLINICAL DATA:  Hand injury  EXAM: LEFT HAND - COMPLETE 3+ VIEW  COMPARISON:  02/24/2010  FINDINGS: Three views of the left hand submitted. No acute fracture or subluxation. No radiopaque foreign body.  IMPRESSION: No acute fracture or subluxation.   Electronically Signed   By: Natasha Mead   On: 06/27/2013 15:10   Todd Hanna states that the swelling has actually improved but he still cannot make a fist due to pain.  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  Taking Ibuprofen.  Patient Active Problem List   Diagnosis Date Noted  . Left hand pain 06/28/2013  . Hypertension   . Anxiety   . Ulcerative colitis    Past Medical History  Diagnosis Date  . Hypertension   . Anxiety   . Ulcerative colitis 2011    colonoscopy at [facility]  . Nephrolithiasis    Past Surgical History  Procedure Laterality Date  . Lithotripsy  2011   History  Substance Use Topics  . Smoking status: Current Every Day Smoker -- 0.50 packs/day    Types: Cigarettes  . Smokeless tobacco: Never Used  . Alcohol Use: Yes   Family History  Problem Relation Age of Onset  . Hypertension Mother    Allergies  Allergen Reactions  . Amoxicillin Hives  . Penicillins Hives   Current Outpatient Prescriptions on File Prior to Visit  Medication Sig Dispense Refill  . cloNIDine (CATAPRES) 0.1 MG tablet Take 0.1 mg by mouth daily.       Marland Kitchen ibuprofen (ADVIL,MOTRIN) 200 MG tablet Take 400 mg by mouth every 6 (six) hours as needed for pain.       No current facility-administered medications on file prior to visit.   The PMH, PSH, Social History, Family History,  Medications, and allergies have been reviewed in [facility], and have been updated if relevant.   Review of Systems    See HPI Objective:   Physical Exam BP 122/76  Pulse 106  Temp(Src) 97.8 F (36.6 C) (Oral)  Wt 113 lb 12 oz (51.597 kg)  BMI 17.81 kg/m2 Constitutional: He is oriented to person, place, and time. He appears well-developed and well-nourished. No distress.  HENT:  Head: Normocephalic and atraumatic.  Right Ear: External ear normal.  Left Ear: External ear normal.  Nose: Nose normal.  Eyes: Conjunctivae are normal.  Neck: Normal range of motion. No tracheal deviation present.  Cardiovascular: Normal rate, regular rhythm and normal heart sounds.  Pulmonary/Chest: Effort normal and breath sounds normal. No stridor.  Abdominal: Soft. He exhibits no distension. There is no tenderness.  Musculoskeletal: Normal range of motion.  Bruising, yellow discoloration and swelling to 2nd and 3rd digits on left hand mainly around PIP. Flexion and extension intact but painful.  Cannot make full fist due to pain., Neurological: He is alert and oriented to person, place, and time.  Neurovascularly intact.  Skin: Skin is warm and dry. He is not diaphoretic.  Small abrasion over her left 2nd PIP, well healed without drainage.  Psychiatric: He has a normal  mood and affect. His behavior is normal.         Assessment & Plan:  1. Left hand pain Without indication of fracture or compartment syndrome. Note given to keep him out of work through next Wednesday. Advised scheduled Ibuprofen with food and rx given for Percocet- see AVS. Did advise seeing hand surgeon if symptoms did not resolve as expected. The patient indicates understanding of these issues and agrees with the plan.

## 2013-07-04 ENCOUNTER — Encounter: Payer: Self-pay | Admitting: *Deleted

## 2013-07-04 ENCOUNTER — Telehealth: Payer: Self-pay | Admitting: *Deleted

## 2013-07-04 NOTE — Telephone Encounter (Signed)
Ok to give note for dates requested but if he is having that much trouble with mobility, he needs to see the hand surgeon as we discussed.

## 2013-07-04 NOTE — Telephone Encounter (Signed)
Pt was seen last week for a hand injury, pt tried to go back to work yesterday and they sent him home because he still doesn't have a lot of movement in hand, and couldn't work, pt would like a note excusing him from work for a few more days (at least until next week) please advise

## 2013-07-04 NOTE — Telephone Encounter (Signed)
Letter done and pt notified letter ready for pick up and pt advise if no improvement let us know so we can refer to hand surgeon if needed, pt verbalized understanding

## 2013-07-18 ENCOUNTER — Telehealth: Payer: Self-pay

## 2013-07-18 ENCOUNTER — Ambulatory Visit (INDEPENDENT_AMBULATORY_CARE_PROVIDER_SITE_OTHER): Payer: PRIVATE HEALTH INSURANCE | Admitting: Family Medicine

## 2013-07-18 ENCOUNTER — Encounter: Payer: Self-pay | Admitting: Family Medicine

## 2013-07-18 VITALS — BP 138/80 | HR 110 | Temp 98.5°F | Wt 118.2 lb

## 2013-07-18 DIAGNOSIS — I10 Essential (primary) hypertension: Secondary | ICD-10-CM

## 2013-07-18 DIAGNOSIS — F411 Generalized anxiety disorder: Secondary | ICD-10-CM

## 2013-07-18 DIAGNOSIS — Z23 Encounter for immunization: Secondary | ICD-10-CM

## 2013-07-18 DIAGNOSIS — F419 Anxiety disorder, unspecified: Secondary | ICD-10-CM

## 2013-07-18 MED ORDER — CLONAZEPAM 0.5 MG PO TABS: 0.5000 mg | ORAL_TABLET | Freq: Two times a day (BID) | ORAL | Status: DC | PRN

## 2013-07-18 MED ORDER — HYDROXYZINE HCL 10 MG PO TABS: 10.0000 mg | ORAL_TABLET | Freq: Three times a day (TID) | ORAL | Status: DC | PRN

## 2013-07-18 NOTE — Telephone Encounter (Signed)
Will see today.  

## 2013-07-18 NOTE — Assessment & Plan Note (Signed)
Elevated today - will increase clonidine to two tablets daily.  Pt will monitor bp and notify us if persistently elevated.

## 2013-07-18 NOTE — Assessment & Plan Note (Signed)
Deteriorated. A total of 25 minutes were spent face-to-face with the patient during this encounter and over half of that time was spent on counseling and coordination of care Will increase clonidine to help with anxiety - will also start atarax low dose (higher doses have caused sedation in past). If not helping, pt may start klonopin 0.5mg  as needed for anxiety.  Discussed addiction potential, discussed dependence and tolerance side effects.   Discussed return to [facility] for counseling which may help, pt declines for now. Discussed importance of healthy relaxation techniques. RTC PRN.

## 2013-07-18 NOTE — Addendum Note (Signed)
Addended by: Royann Shivers A on: 07/18/2013 12:02 PM   Modules accepted: Orders

## 2013-07-18 NOTE — Telephone Encounter (Signed)
Pt is very nervous and anxious; pt going thru child custody issues, not sleeping well at night. No SI/HI. Dr Deborra Medina has no available appts; pt scheduled with Dr Darnell Level today at 11:15 am.

## 2013-07-18 NOTE — Progress Notes (Signed)
  Subjective:    Patient ID: Todd Hanna, male    DOB: [birth year], 27 y.o.   MRN: [id number]  HPI CC: increasing anxiety  Recent increase in anxiety for last 2-3 months.  Certain situations bring anxiety out - feel like anxiety attacks.  Starts feeling shakey, hypervigilant, diaphoresis, dyspnea, breathes faster, head feels cold.  No chest pain, HA, dizziness.  Taking clonidine 0.1mg  daily - has been regular with this medication.  Significantly helped in the past. States had MI when he was 27 yo.  Thought due to overdose of energy pills. Went to [employer] and was started on clonidine.  Won custody of daughter this year (mother of child had h/o heavy drinking). 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  Going to school - Nurse, adult. 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  Mainly when around any new customers, investors, etc.  This is very different.  So far has tried dramamine (didn't help), mother's alprazolam (which helped).  Prior on clonazepam as well. Has also tried zoloft, paxil, celexa, lexapro, buspar and remeron.  Denies alcohol use. Denies rec drug use. Trying to quit smoking with e cig.  Down to 4 cig/day.  Past Medical History  Diagnosis Date  . Hypertension   . Anxiety   . Ulcerative colitis 2011    colonoscopy at [facility]  . Nephrolithiasis     Past Surgical History  Procedure Laterality Date  . Lithotripsy  2011     Review of Systems Per HPI    Objective:   Physical Exam  Nursing note and vitals reviewed. Constitutional: He appears well-developed and well-nourished. No distress.  HENT:  Mouth/Throat: Oropharynx is clear and moist. No oropharyngeal exudate.  Cardiovascular: Regular rhythm, normal heart sounds and intact distal pulses.  Tachycardia present.    No murmur heard. Pulmonary/Chest: Effort normal and breath sounds normal. No respiratory distress. He has no wheezes. He has no rales.  Psychiatric: His mood appears anxious.  Increased anxiety, rapid speech       Assessment & Plan:

## 2013-07-18 NOTE — Patient Instructions (Addendum)
Let's increase clonidine to two tablets daily.  Watch blood pressure and let us know if <100/50. Try hydroxyzine or atarax to use as needed for anxiety. If this doesn't help, may use klonopin low dose as needed for anxiety. Think about return to [facility] for further counseling/therapy to work on Brewing technologist. Work on healthy stress relieving strategies.

## 2013-07-19 ENCOUNTER — Other Ambulatory Visit: Payer: Self-pay | Admitting: Family Medicine

## 2013-07-31 ENCOUNTER — Other Ambulatory Visit: Payer: Self-pay | Admitting: Family Medicine

## 2013-08-02 ENCOUNTER — Other Ambulatory Visit: Payer: Self-pay | Admitting: Family Medicine

## 2013-08-02 MED ORDER — CLONAZEPAM 0.5 MG PO TABS: 0.5000 mg | ORAL_TABLET | Freq: Two times a day (BID) | ORAL | Status: DC | PRN

## 2013-08-02 NOTE — Telephone Encounter (Signed)
Phoned in to pharmacy. 

## 2013-08-02 NOTE — Telephone Encounter (Signed)
Ok to refill- phone in

## 2013-08-15 ENCOUNTER — Other Ambulatory Visit: Payer: Self-pay | Admitting: Family Medicine

## 2013-08-15 NOTE — Telephone Encounter (Signed)
Ok to refill 

## 2013-08-16 ENCOUNTER — Other Ambulatory Visit: Payer: Self-pay | Admitting: Internal Medicine

## 2013-08-16 NOTE — Telephone Encounter (Signed)
Ok to phone in.

## 2013-08-19 NOTE — Telephone Encounter (Signed)
This was approved 3 days ago, can you double check to make sure it was phoned in

## 2013-08-19 NOTE — Telephone Encounter (Signed)
Spoke to pharmacist and was advised that she refill has not been called in. Gave okay for refill as instructed.

## 2013-09-02 ENCOUNTER — Other Ambulatory Visit: Payer: Self-pay | Admitting: Family Medicine

## 2013-09-02 NOTE — Telephone Encounter (Signed)
Ok to refill 

## 2013-09-18 ENCOUNTER — Other Ambulatory Visit: Payer: Self-pay | Admitting: Internal Medicine

## 2013-09-18 NOTE — Telephone Encounter (Signed)
Last filled 08/16/13 Klonopin

## 2013-09-18 NOTE — Telephone Encounter (Signed)
Rx called in to pharmacy. 

## 2013-09-18 NOTE — Telephone Encounter (Signed)
Ok to call in clonazepam

## 2013-10-17 ENCOUNTER — Other Ambulatory Visit: Payer: Self-pay | Admitting: Internal Medicine

## 2013-10-17 NOTE — Telephone Encounter (Signed)
Last filled 09/18/13--please advise

## 2013-10-17 NOTE — Telephone Encounter (Signed)
Spoke to pt and informed him Rx has been sent to requested pharmacy 

## 2013-11-15 ENCOUNTER — Other Ambulatory Visit: Payer: Self-pay | Admitting: Family Medicine

## 2013-11-15 NOTE — Telephone Encounter (Signed)
Rx called in to requested pharmacy 

## 2013-11-15 NOTE — Telephone Encounter (Signed)
Pt requests medication refill. Last ov 06/2013 with no future appts scheduled. pls advise

## 2013-11-27 ENCOUNTER — Ambulatory Visit: Payer: PRIVATE HEALTH INSURANCE | Admitting: Family Medicine

## 2013-11-27 ENCOUNTER — Ambulatory Visit (INDEPENDENT_AMBULATORY_CARE_PROVIDER_SITE_OTHER): Payer: PRIVATE HEALTH INSURANCE | Admitting: Family Medicine

## 2013-11-27 ENCOUNTER — Encounter: Payer: Self-pay | Admitting: Family Medicine

## 2013-11-27 ENCOUNTER — Ambulatory Visit (INDEPENDENT_AMBULATORY_CARE_PROVIDER_SITE_OTHER)
Admission: RE | Admit: 2013-11-27 | Discharge: 2013-11-27 | Disposition: A | Discharge status: Home / Self Care | Payer: PRIVATE HEALTH INSURANCE | Source: Ambulatory Visit | Attending: Family Medicine | Admitting: Family Medicine

## 2013-11-27 VITALS — BP 132/82 | HR 93 | Temp 98.2°F | Wt 121.0 lb

## 2013-11-27 DIAGNOSIS — M79645 Pain in left finger(s): Secondary | ICD-10-CM

## 2013-11-27 DIAGNOSIS — M79609 Pain in unspecified limb: Secondary | ICD-10-CM

## 2013-11-27 DIAGNOSIS — S62639A Displaced fracture of distal phalanx of unspecified finger, initial encounter for closed fracture: Secondary | ICD-10-CM

## 2013-11-27 DIAGNOSIS — IMO0001 Reserved for inherently not codable concepts without codable children: Secondary | ICD-10-CM

## 2013-11-27 NOTE — Progress Notes (Signed)
Date:  11/27/2013   Name:  Todd Hanna   DOB:  24-Mar-1986   MRN:  [id number]  Primary Physician:  Ruthe Mannan, MD   Chief Complaint: Hand Pain   Subjective:   History of Present Illness:  Todd Hanna is a 28 y.o. pleasant patient who presents with the following:  L hand middle finger  LHD  Brick fell on L hand yesterday building snow ramp for his girlfriend's daughter. Immediately felt significant pain, developed swelling, bruising, lack of motion at the DIP and PIP on the 3rd digit.   Reports having fractured hand and finger a few months ago, but notes appear to indicate that there was no fracture.   Reports multiple other fractures.   Patient Active Problem List   Diagnosis Date Noted  . Left hand pain 06/28/2013  . Hypertension   . Anxiety   . Ulcerative colitis     Past Medical History  Diagnosis Date  . Hypertension   . Anxiety   . Ulcerative colitis 2011    colonoscopy at [facility]  . Nephrolithiasis     Past Surgical History  Procedure Laterality Date  . Lithotripsy  2011    History   Social History  . Marital Status: Single    Spouse Name: N/A    Number of Children: 1  . Years of Education: N/A   Occupational History  . 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 Smoking status: Current Every Day Smoker -- 0.25 packs/day    Types: Cigarettes  . Smokeless tobacco: Never Used     Comment: ~ only about 4 cigs daily  . Alcohol Use: No  . Drug Use: No  . Sexual Activity: Not on file   Other Topics Concern  . Not on file   Social History Narrative  . No narrative on file    Family History  Problem Relation Age of Onset  . Hypertension Mother     Allergies  Allergen Reactions  . Amoxicillin Hives  . Penicillins Hives    Medication list has been reviewed and updated.  Review of Systems:  GEN: No fevers, chills. Nontoxic. Primarily MSK c/o today. MSK: Detailed in the HPI GI: tolerating PO intake without  difficulty Neuro: some distal parasthesias, tingling Otherwise the pertinent positives of the ROS are noted above.   Objective:   Physical Examination: BP 132/82  Pulse 93  Temp(Src) 98.2 F (36.8 C) (Oral)  Wt 121 lb (54.885 kg)  SpO2 97%  Ideal Body Weight:     GEN: WDWN, NAD, Non-toxic, Alert & Oriented x 3 HEENT: Atraumatic, Normocephalic.  Ears and Nose: No external deformity. EXTR: No clubbing/cyanosis/edema NEURO: Normal gait.  PSYCH: Normally interactive. Conversant. Not depressed or anxious appearing.  Calm demeanor.   L hand Ecchymosis or edema: 3RD ROM wrist/hand/digits: MINIMAL AT DIP, SOME AT PIP OF THE 3RD. Carpals, MCP's, digits: NT Distal Ulna and Radius: NT Ecchymosis or edema: neg No instability Cysts/nodules: neg Digit triggering: neg Finkelstein's test: neg Snuffbox tenderness: neg Scaphoid tubercle: NT composite fist, no malrotation: UNABLE TO FULLY FLEX 3RD DIGIT Grip, all digits: 5/5 str DIPJT: TTP 3RD PIP JT: TTP 3RD MCP JT: NT No tenosynovitis Axial load test: neg Atrophy: neg  Hand sensation: somewhat diminished in 3rd and to lesser extend 4th  Dg Finger Middle Left  11/27/2013   CLINICAL DATA:  Smashed finger  EXAM: LEFT MIDDLE FINGER 2+V  COMPARISON:  None.  FINDINGS: Three views of left middle  finger submitted. There is mild displaced avulsed bony fragment at the tip of distal phalanx. There is adjacent soft tissue swelling.  IMPRESSION: Avulsion fracture with mild displaced fragment at the tip of distal phalanx with adjacent soft tissue swelling.   Electronically Signed   By: Natasha MeadLiviu  Pop M.D.   On: 11/27/2013 11:03    Assessment & Plan:   Closed fracture of distal phalanx of third finger of left hand  Finger pain, left - Plan: DG Finger Middle Left  DOI 11/26/2013  Mildly displaced tip of distal 3rd, dominant hand. Work note given. If can use R hand, I think that he can continue working.  Aluminum splint and tape applied without  difficulty.  A UNIVERSAL FRACTURE CHARGE HAS BEEN APPLIED IN THE CARE OF THIS INJURY.  No co-pay should be applied in the future, and there is a 90 day follow-up period for subsequent care of this injury.   New Prescriptions   No medications on file   Orders Placed This Encounter  Procedures  . DG Finger Middle Left   Signed,  Nzinga Ferran T. Iyari Hagner, MD, CAQ Sports Medicine  [facility] [address] [place] [address] Phone: [phone] Fax: [phone]  Patient Instructions  F/u 2 weeks to recheck   Patient's Medications  New Prescriptions   No medications on file  Previous Medications   CLONAZEPAM (KLONOPIN) 0.5 MG TABLET    TAKE 1 TABLET TWICE A DAY AS NEEDED ANXIETY   CLONIDINE (CATAPRES) 0.1 MG TABLET    TAKE 1 TABLET BY MOUTH EVERY DAY   HYDROXYZINE (ATARAX/VISTARIL) 10 MG TABLET    TAKE 1 TABLET BY MOUTH EVERY 8 HOURS AS NEEDED FOR ANXIETY  Modified Medications   No medications on file  Discontinued Medications   CLONIDINE (CATAPRES) 0.1 MG TABLET    Take 0.1 mg by mouth daily.

## 2013-11-27 NOTE — Progress Notes (Signed)
Pre visit review using our clinic review tool, if applicable. No additional management support is needed unless otherwise documented below in the visit note. 

## 2013-11-27 NOTE — Patient Instructions (Signed)
F/u 2 weeks to recheck

## 2013-11-29 ENCOUNTER — Telehealth: Payer: Self-pay | Admitting: Family Medicine

## 2013-11-29 NOTE — Telephone Encounter (Signed)
Relevant patient education mailed to patient.  

## 2013-12-13 ENCOUNTER — Other Ambulatory Visit: Payer: Self-pay | Admitting: Family Medicine

## 2013-12-13 NOTE — Telephone Encounter (Signed)
Pt requesting medication refill. Last ov 06/2013 with no future appts scheduled. pls advise. 

## 2013-12-16 NOTE — Telephone Encounter (Signed)
As suspected, pharmacy contacted and pt received medication on 12/13/13

## 2013-12-16 NOTE — Telephone Encounter (Signed)
Lm on pts vm informing him Rx has been called in to requested pharmacy 

## 2013-12-16 NOTE — Telephone Encounter (Signed)
I think I approved this last week- if not already refilled, ok to refill one time only.

## 2013-12-16 NOTE — Telephone Encounter (Signed)
Pt requesting medication refill. Last ov 06/2013 with no future appts scheduled. pls advise. 

## 2014-01-14 ENCOUNTER — Other Ambulatory Visit: Payer: Self-pay | Admitting: Family Medicine

## 2014-01-14 NOTE — Telephone Encounter (Signed)
Pt requesting medication refill. Last ov 06/2013 with no future appts scheduled. pls advise. 

## 2014-01-14 NOTE — Telephone Encounter (Signed)
Spoke to pt and informed him Rx has been called in to requested pharmacy 

## 2014-02-12 ENCOUNTER — Other Ambulatory Visit: Payer: Self-pay | Admitting: Family Medicine

## 2014-02-12 NOTE — Telephone Encounter (Signed)
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 Rx called in to requested pharmacy

## 2014-02-12 NOTE — Telephone Encounter (Signed)
Pt requesting medication refill. Last ov acute 11/2013 with no future appts scheduled. pls advise

## 2014-03-07 ENCOUNTER — Other Ambulatory Visit: Payer: Self-pay | Admitting: Family Medicine

## 2014-03-08 NOTE — Telephone Encounter (Signed)
To PCP

## 2014-03-12 ENCOUNTER — Other Ambulatory Visit: Payer: Self-pay | Admitting: Family Medicine

## 2014-03-13 ENCOUNTER — Other Ambulatory Visit: Payer: Self-pay | Admitting: Family Medicine

## 2014-03-13 NOTE — Telephone Encounter (Signed)
Attempted to contact pt but phone is not accepting calls at this time. Rx has been faxed to requested pharmacy

## 2014-03-13 NOTE — Telephone Encounter (Signed)
Pt requesting medication refill. Last f/u appt 06/2012 with no future appts scheduled. pls advise

## 2014-04-28 ENCOUNTER — Other Ambulatory Visit: Payer: Self-pay | Admitting: Family Medicine

## 2014-05-28 ENCOUNTER — Other Ambulatory Visit: Payer: Self-pay | Admitting: Family Medicine

## 2014-05-29 NOTE — Telephone Encounter (Signed)
Rx faxed to requested pharmacy 

## 2014-05-29 NOTE — Telephone Encounter (Signed)
Pt requesting medication refill. Last f/u 2013; only acute recently, with no future appts scheduled. pls advise

## 2014-06-30 ENCOUNTER — Other Ambulatory Visit: Payer: Self-pay | Admitting: *Deleted

## 2014-06-30 NOTE — Telephone Encounter (Signed)
Ok to refill one time only.  Needs 30 minute office visit- follow up anxiety- for further refills.

## 2014-06-30 NOTE — Telephone Encounter (Signed)
Pt's mother called and left vm requesting clonazepam refills for pt. She says pharmacy sent requests last week, and still haven't gotten a response back. Pt has been out of med since the end of last week. Pt last seen for an acute ov in 2/15, and prior to that in 10/14 for anxiety with Dr. Reece Agar. No future appts scheduled.

## 2014-07-01 MED ORDER — CLONAZEPAM 0.5 MG PO TABS: ORAL_TABLET | ORAL | Status: DC

## 2014-07-01 NOTE — Telephone Encounter (Signed)
Spoke to pts mother and scheduled f/u appt for 10/5. She was advised that pt must attend appt to receive any additional refills

## 2014-07-07 ENCOUNTER — Encounter: Payer: Self-pay | Admitting: Family Medicine

## 2014-07-07 ENCOUNTER — Ambulatory Visit (INDEPENDENT_AMBULATORY_CARE_PROVIDER_SITE_OTHER): Payer: Self-pay | Admitting: Family Medicine

## 2014-07-07 VITALS — BP 110/72 | HR 100 | Temp 98.2°F | Wt 124.2 lb

## 2014-07-07 DIAGNOSIS — F419 Anxiety disorder, unspecified: Secondary | ICD-10-CM

## 2014-07-07 DIAGNOSIS — F4310 Post-traumatic stress disorder, unspecified: Secondary | ICD-10-CM

## 2014-07-07 MED ORDER — CLONAZEPAM 0.5 MG PO TABS: ORAL_TABLET | ORAL | Status: DC

## 2014-07-07 MED ORDER — CLONIDINE HCL 0.1 MG PO TABS: ORAL_TABLET | ORAL | Status: DC

## 2014-07-07 NOTE — Progress Notes (Signed)
Pre visit review using our clinic review tool, if applicable. No additional management support is needed unless otherwise documented below in the visit note. 
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
## 2014-07-07 NOTE — Assessment & Plan Note (Signed)
Deteriorated with new recent events, likely also has a component of PTSD. >25 minutes spent in face to face time with patient, >50% spent in counselling or coordination of care Continue psychotherapy. eRx for klonopin refilled.  I did advise that he try to break 0.5 mg tablet in half so that the does not develop tolerance or become addicted. The patient indicates understanding of these issues and agrees with the plan.

## 2014-08-27 ENCOUNTER — Other Ambulatory Visit: Payer: Self-pay | Admitting: *Deleted

## 2014-08-27 MED ORDER — CLONAZEPAM 0.5 MG PO TABS: ORAL_TABLET | ORAL | Status: DC

## 2014-08-27 NOTE — Telephone Encounter (Signed)
Rx faxed to requested pharmacy 

## 2014-08-27 NOTE — Telephone Encounter (Signed)
Last filled 07/28/14, pt's last ov was on 07/07/14.

## 2014-09-24 ENCOUNTER — Other Ambulatory Visit: Payer: Self-pay | Admitting: *Deleted

## 2014-09-24 MED ORDER — CLONAZEPAM 0.5 MG PO TABS: ORAL_TABLET | ORAL | Status: DC

## 2014-09-24 NOTE — Telephone Encounter (Signed)
Pt requesting medication refill. Last f/u appt 07/2014. pls advise 

## 2014-09-25 NOTE — Telephone Encounter (Signed)
Rx called in to requested pharmacy 

## 2014-09-28 ENCOUNTER — Emergency Department (HOSPITAL_COMMUNITY): Payer: Self-pay

## 2014-09-28 ENCOUNTER — Emergency Department (HOSPITAL_COMMUNITY)
Admission: EM | Admit: 2014-09-28 | Discharge: 2014-09-29 | Disposition: A | Discharge status: Home / Self Care | Payer: Self-pay | Attending: Emergency Medicine | Admitting: Emergency Medicine

## 2014-09-28 ENCOUNTER — Encounter (HOSPITAL_COMMUNITY): Payer: Self-pay | Admitting: Emergency Medicine

## 2014-09-28 DIAGNOSIS — F419 Anxiety disorder, unspecified: Secondary | ICD-10-CM | POA: Insufficient documentation

## 2014-09-28 DIAGNOSIS — Z8719 Personal history of other diseases of the digestive system: Secondary | ICD-10-CM | POA: Insufficient documentation

## 2014-09-28 DIAGNOSIS — S01311A Laceration without foreign body of right ear, initial encounter: Secondary | ICD-10-CM | POA: Insufficient documentation

## 2014-09-28 DIAGNOSIS — Z880 Allergy status to penicillin: Secondary | ICD-10-CM | POA: Insufficient documentation

## 2014-09-28 DIAGNOSIS — I10 Essential (primary) hypertension: Secondary | ICD-10-CM | POA: Insufficient documentation

## 2014-09-28 DIAGNOSIS — R55 Syncope and collapse: Secondary | ICD-10-CM | POA: Insufficient documentation

## 2014-09-28 DIAGNOSIS — Y998 Other external cause status: Secondary | ICD-10-CM | POA: Insufficient documentation

## 2014-09-28 DIAGNOSIS — Z720 Tobacco use: Secondary | ICD-10-CM | POA: Insufficient documentation

## 2014-09-28 DIAGNOSIS — Y9389 Activity, other specified: Secondary | ICD-10-CM | POA: Insufficient documentation

## 2014-09-28 DIAGNOSIS — S3992XA Unspecified injury of lower back, initial encounter: Secondary | ICD-10-CM | POA: Insufficient documentation

## 2014-09-28 DIAGNOSIS — S0083XA Contusion of other part of head, initial encounter: Secondary | ICD-10-CM

## 2014-09-28 DIAGNOSIS — Y9289 Other specified places as the place of occurrence of the external cause: Secondary | ICD-10-CM | POA: Insufficient documentation

## 2014-09-28 DIAGNOSIS — Z79899 Other long term (current) drug therapy: Secondary | ICD-10-CM | POA: Insufficient documentation

## 2014-09-28 DIAGNOSIS — Z87442 Personal history of urinary calculi: Secondary | ICD-10-CM | POA: Insufficient documentation

## 2014-09-28 DIAGNOSIS — R1110 Vomiting, unspecified: Secondary | ICD-10-CM | POA: Insufficient documentation

## 2014-09-28 MED ORDER — LIDOCAINE HCL (PF) 1 % IJ SOLN
30.0000 mL | Freq: Once | INTRAMUSCULAR | Status: DC
  Filled 2014-09-28: qty 30

## 2014-09-28 MED ORDER — LIDOCAINE HCL (PF) 1 % IJ SOLN: 2.0000 mL | Freq: Once | INTRAMUSCULAR | Status: DC

## 2014-09-28 MED ORDER — LIDOCAINE HCL (PF) 1 % IJ SOLN
INTRAMUSCULAR | Status: AC
  Filled 2014-09-28: qty 5

## 2014-09-28 MED ORDER — LIDOCAINE HCL (PF) 1 % IJ SOLN
5.0000 mL | Freq: Once | INTRAMUSCULAR | Status: AC
  Administered 2014-09-28: 5 mL
  Filled 2014-09-28: qty 5

## 2014-09-28 MED ORDER — HYDROCODONE-ACETAMINOPHEN 5-325 MG PO TABS: 2.0000 | ORAL_TABLET | ORAL | Status: DC | PRN

## 2014-09-28 MED ORDER — IBUPROFEN 800 MG PO TABS: 800.0000 mg | ORAL_TABLET | Freq: Three times a day (TID) | ORAL | Status: DC

## 2014-09-28 MED ORDER — OMEPRAZOLE 20 MG PO CPDR: 20.0000 mg | DELAYED_RELEASE_CAPSULE | Freq: Every day | ORAL | Status: DC
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
## 2014-09-28 NOTE — ED Notes (Signed)
Pt can tolerate PO fluids but states the fluid hurts his jaw when he swallows.
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
## 2014-09-28 NOTE — ED Notes (Signed)
Pt taken to xr/ct 

## 2014-09-28 NOTE — ED Notes (Signed)
Pt returned from ct

## 2014-09-28 NOTE — ED Notes (Signed)
Laceration to posterior right ear noted.

## 2014-09-28 NOTE — ED Notes (Signed)
Pt ambulates with no difficulties/pain. Pt steady on feet with no assistance.

## 2014-09-28 NOTE — ED Notes (Signed)
EDP at bedside  

## 2014-09-28 NOTE — Discharge Instructions (Signed)
Contusion Apply ice to your face as discussed. He needs sutures removed in one week. Return to the ED develop worsening pain, vomiting, confusion or any other concerns. A contusion is a deep bruise. Contusions are the result of an injury that caused bleeding under the skin. The contusion may turn blue, purple, or yellow. Minor injuries will give you a painless contusion, but more severe contusions may stay painful and swollen for a few weeks.  CAUSES  A contusion is usually caused by a blow, trauma, or direct force to an area of the body. SYMPTOMS   Swelling and redness of the injured area.  Bruising of the injured area.  Tenderness and soreness of the injured area.  Pain. DIAGNOSIS  The diagnosis can be made by taking a history and physical exam. An X-ray, CT scan, or MRI may be needed to determine if there were any associated injuries, such as fractures. TREATMENT  Specific treatment will depend on what area of the body was injured. In general, the best treatment for a contusion is resting, icing, elevating, and applying cold compresses to the injured area. Over-the-counter medicines may also be recommended for pain control. Ask your caregiver what the best treatment is for your contusion. HOME CARE INSTRUCTIONS   Put ice on the injured area.  Put ice in a plastic bag.  Place a towel between your skin and the bag.  Leave the ice on for 15-20 minutes, 3-4 times a day, or as directed by your health care provider.  Only take over-the-counter or prescription medicines for pain, discomfort, or fever as directed by your caregiver. Your caregiver may recommend avoiding anti-inflammatory medicines (aspirin, ibuprofen, and naproxen) for 48 hours because these medicines may increase bruising.  Rest the injured area.  If possible, elevate the injured area to reduce swelling. SEEK IMMEDIATE MEDICAL CARE IF:   You have increased bruising or swelling.  You have pain that is getting  worse.  Your swelling or pain is not relieved with medicines. MAKE SURE YOU:   Understand these instructions.  Will watch your condition.  Will get help right away if you are not doing well or get worse. Document Released: 06/29/2005 Document Revised: 09/24/2013 Document Reviewed: 07/25/2011 [facility] Patient Information 2015 [place], [place]. This information is not intended to replace advice given to you by your health care provider. Make sure you discuss any questions you have with your health care provider.
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
## 2014-10-23 ENCOUNTER — Ambulatory Visit (INDEPENDENT_AMBULATORY_CARE_PROVIDER_SITE_OTHER): Payer: Self-pay | Admitting: Family Medicine

## 2014-10-23 ENCOUNTER — Encounter: Payer: Self-pay | Admitting: Family Medicine

## 2014-10-23 ENCOUNTER — Other Ambulatory Visit: Payer: Self-pay | Admitting: Family Medicine

## 2014-10-23 VITALS — BP 128/70 | HR 102 | Temp 98.0°F | Ht 67.0 in | Wt 115.8 lb

## 2014-10-23 DIAGNOSIS — S01311D Laceration without foreign body of right ear, subsequent encounter: Secondary | ICD-10-CM

## 2014-10-23 NOTE — Telephone Encounter (Signed)
Pt last OV 07/2014.

## 2014-10-23 NOTE — Progress Notes (Signed)
   Dr. Karleen HampshireSpencer T. Zacharie Portner, MD, CAQ Sports Medicine [facility] [address] [place], [id number] Phone: [phone] Fax: [phone]  10/23/2014  Patient: Todd PassyKerry C Siek, MRN: [id number], DOB: January 13, 1986, 29 y.o.  Primary Physician:  Ruthe Mannanalia Aron, MD  Chief Complaint: Suture / Staple Removal   The patient had 7 stitches placed on 09/28/2014. They have been remaining in place since then. We removed 7 stitches without difficulty.  No charge in this case.  Signed,  Elpidio GaleaSpencer T. Geoffry Bannister, MD

## 2014-10-23 NOTE — Progress Notes (Signed)
Pre visit review using our clinic review tool, if applicable. No additional management support is needed unless otherwise documented below in the visit note. 

## 2014-10-24 ENCOUNTER — Other Ambulatory Visit: Payer: Self-pay | Admitting: Family Medicine

## 2014-10-24 ENCOUNTER — Ambulatory Visit: Payer: Self-pay | Admitting: Family Medicine

## 2014-10-27 NOTE — Telephone Encounter (Signed)
Tried to call in refill and was advised by pharmacist that this has already been filled.
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
## 2014-11-24 ENCOUNTER — Other Ambulatory Visit: Payer: Self-pay | Admitting: Family Medicine

## 2014-11-24 NOTE — Telephone Encounter (Signed)
Last f/u appt 07/2014 

## 2014-11-24 NOTE — Telephone Encounter (Signed)
Rx called in to requested pharmacy 

## 2014-12-22 ENCOUNTER — Other Ambulatory Visit: Payer: Self-pay | Admitting: Family Medicine

## 2014-12-22 NOTE — Telephone Encounter (Signed)
Last f/u appt 07/2014 

## 2014-12-22 NOTE — Telephone Encounter (Signed)
Rx called in to requested pharmacy 

## 2015-01-20 ENCOUNTER — Other Ambulatory Visit: Payer: Self-pay | Admitting: Family Medicine

## 2015-01-20 NOTE — Telephone Encounter (Signed)
Last f/u appt 07/2014 

## 2015-01-20 NOTE — Telephone Encounter (Signed)
Rx called in to requested pharmacy 

## 2015-02-17 ENCOUNTER — Other Ambulatory Visit: Payer: Self-pay | Admitting: Family Medicine

## 2015-02-17 NOTE — Telephone Encounter (Signed)
Last f/u appt 07/2014 

## 2015-02-17 NOTE — Telephone Encounter (Signed)
Rx called in to requested pharmacy 

## 2015-03-16 ENCOUNTER — Other Ambulatory Visit: Payer: Self-pay | Admitting: Family Medicine

## 2015-03-18 ENCOUNTER — Other Ambulatory Visit: Payer: Self-pay | Admitting: Family Medicine

## 2015-03-18 NOTE — Telephone Encounter (Signed)
Rx called in to requested pharmacy 

## 2015-03-18 NOTE — Telephone Encounter (Signed)
Last f/u 07/2014 

## 2015-03-21 ENCOUNTER — Emergency Department (HOSPITAL_COMMUNITY)
Admission: EM | Admit: 2015-03-21 | Discharge: 2015-03-21 | Disposition: A | Discharge status: Home / Self Care | Payer: Self-pay | Attending: Emergency Medicine | Admitting: Emergency Medicine

## 2015-03-21 ENCOUNTER — Emergency Department (HOSPITAL_COMMUNITY): Payer: Self-pay

## 2015-03-21 ENCOUNTER — Encounter (HOSPITAL_COMMUNITY): Payer: Self-pay | Admitting: Emergency Medicine

## 2015-03-21 DIAGNOSIS — Z87442 Personal history of urinary calculi: Secondary | ICD-10-CM | POA: Insufficient documentation

## 2015-03-21 DIAGNOSIS — Z720 Tobacco use: Secondary | ICD-10-CM | POA: Insufficient documentation

## 2015-03-21 DIAGNOSIS — Z880 Allergy status to penicillin: Secondary | ICD-10-CM | POA: Insufficient documentation

## 2015-03-21 DIAGNOSIS — I10 Essential (primary) hypertension: Secondary | ICD-10-CM | POA: Insufficient documentation

## 2015-03-21 DIAGNOSIS — Z791 Long term (current) use of non-steroidal anti-inflammatories (NSAID): Secondary | ICD-10-CM | POA: Insufficient documentation

## 2015-03-21 DIAGNOSIS — F419 Anxiety disorder, unspecified: Secondary | ICD-10-CM | POA: Insufficient documentation

## 2015-03-21 DIAGNOSIS — N50811 Right testicular pain: Secondary | ICD-10-CM

## 2015-03-21 DIAGNOSIS — N451 Epididymitis: Secondary | ICD-10-CM | POA: Insufficient documentation

## 2015-03-21 DIAGNOSIS — K5190 Ulcerative colitis, unspecified, without complications: Secondary | ICD-10-CM | POA: Insufficient documentation

## 2015-03-21 DIAGNOSIS — Z79899 Other long term (current) drug therapy: Secondary | ICD-10-CM | POA: Insufficient documentation

## 2015-03-21 LAB — CBC WITH DIFFERENTIAL/PLATELET
Basophils Absolute: 0 10*3/uL (ref 0.0–0.1)
Basophils Relative: 0 % (ref 0–1)
Eosinophils Absolute: 0 10*3/uL (ref 0.0–0.7)
Eosinophils Relative: 0 % (ref 0–5)
HCT: 41.1 % (ref 39.0–52.0)
Hemoglobin: 15 g/dL (ref 13.0–17.0)
Lymphocytes Relative: 10 % — ABNORMAL LOW (ref 12–46)
Lymphs Abs: 1.3 10*3/uL (ref 0.7–4.0)
MCH: 32.4 pg (ref 26.0–34.0)
MCHC: 36.5 g/dL — ABNORMAL HIGH (ref 30.0–36.0)
MCV: 88.8 fL (ref 78.0–100.0)
Monocytes Absolute: 1.4 10*3/uL — ABNORMAL HIGH (ref 0.1–1.0)
Monocytes Relative: 11 % (ref 3–12)
Neutro Abs: 9.9 10*3/uL — ABNORMAL HIGH (ref 1.7–7.7)
Neutrophils Relative %: 79 % — ABNORMAL HIGH (ref 43–77)
Platelets: 199 10*3/uL (ref 150–400)
RBC: 4.63 MIL/uL (ref 4.22–5.81)
RDW: 12.6 % (ref 11.5–15.5)
WBC: 12.6 10*3/uL — ABNORMAL HIGH (ref 4.0–10.5)

## 2015-03-21 LAB — BASIC METABOLIC PANEL
Anion gap: 12 (ref 5–15)
BUN: 13 mg/dL (ref 6–20)
CO2: 25 mmol/L (ref 22–32)
Calcium: 8.9 mg/dL (ref 8.9–10.3)
Chloride: 96 mmol/L — ABNORMAL LOW (ref 101–111)
Creatinine, Ser: 1.11 mg/dL (ref 0.61–1.24)
GFR calc Af Amer: >60 mL/min (ref >60)
GFR calc non Af Amer: >60 mL/min (ref >60)
Glucose, Bld: 121 mg/dL — ABNORMAL HIGH (ref 65–99)
Potassium: 3.8 mmol/L (ref 3.5–5.1)
Sodium: 133 mmol/L — ABNORMAL LOW (ref 135–145)

## 2015-03-21 LAB — URINALYSIS, ROUTINE W REFLEX MICROSCOPIC
Bilirubin Urine: NEGATIVE
Glucose, UA: NEGATIVE mg/dL
Ketones, ur: 15 mg/dL — AB
Nitrite: POSITIVE — AB
Protein, ur: 100 mg/dL — AB
Specific Gravity, Urine: 1.023 (ref 1.005–1.030)
Urobilinogen, UA: 1 mg/dL (ref 0.0–1.0)
pH: 6 (ref 5.0–8.0)

## 2015-03-21 LAB — URINE MICROSCOPIC-ADD ON

## 2015-03-21 MED ORDER — OXYCODONE-ACETAMINOPHEN 5-325 MG PO TABS: 1.0000 | ORAL_TABLET | Freq: Four times a day (QID) | ORAL | Status: DC | PRN

## 2015-03-21 MED ORDER — DOXYCYCLINE HYCLATE 100 MG PO CAPS: 100.0000 mg | ORAL_CAPSULE | Freq: Two times a day (BID) | ORAL | Status: DC

## 2015-03-21 MED ORDER — HYDROMORPHONE HCL 1 MG/ML IJ SOLN
1.0000 mg | Freq: Once | INTRAMUSCULAR | Status: AC
  Administered 2015-03-21: 1 mg via INTRAVENOUS
  Filled 2015-03-21: qty 1

## 2015-03-21 MED ORDER — DEXTROSE 5 % IV SOLN
1.0000 g | Freq: Once | INTRAVENOUS | Status: AC
  Administered 2015-03-21: 1 g via INTRAVENOUS
  Filled 2015-03-21: qty 10

## 2015-03-21 MED ORDER — MORPHINE SULFATE 4 MG/ML IJ SOLN
4.0000 mg | Freq: Once | INTRAMUSCULAR | Status: AC
  Administered 2015-03-21: 4 mg via INTRAVENOUS
  Filled 2015-03-21: qty 1

## 2015-03-21 NOTE — ED Notes (Signed)
Pt c/o swelling to right testicle x 2-3 days ago. Pt denies recent injury.

## 2015-03-21 NOTE — ED Notes (Signed)
Patient being transported to [facility] at this time. 

## 2015-03-21 NOTE — ED Notes (Signed)
Patient returned from Ultrasound. 

## 2015-03-21 NOTE — Discharge Instructions (Signed)
Return here as needed. Follow up with the Urologist provided. Use ice on the area.

## 2015-03-24 LAB — URINE CULTURE: Culture: >=100000

## 2015-03-24 NOTE — ED Provider Notes (Signed)
CSN: [id number]     Arrival date & time 03/21/15  1027 History   First MD Initiated Contact with Patient 03/21/15 1049     Chief Complaint  Patient presents with  . Groin Swelling     (Consider location/radiation/quality/duration/timing/severity/associated sxs/prior Treatment) HPI Patient presents to the emergency department with pain and swelling in the R testicle. The patient states that this started two days ago but has gotten worse. The pain is worse with palpation and movement of the scrotum.  He states that he has not had any penile discharge, dysuria, hematuria, abdominal pain, back pain, fever, incontinence, nausea, vomiting, diarrhea, chest pain, shortness of breath or syncope.  The patient states that he did not take any medication prior to arrival.  Patient states that he has not noticed any open wound in the scrotal region Past Medical History  Diagnosis Date  . Hypertension   . Anxiety   . Ulcerative colitis 2011    colonoscopy at [facility]  . Nephrolithiasis    Past Surgical History  Procedure Laterality Date  . Lithotripsy  2011   Family History  Problem Relation Age of Onset  . Hypertension Mother    History  Substance Use Topics  . Smoking status: Current Every Day Smoker -- 0.25 packs/day    Types: Cigarettes  . Smokeless tobacco: Never Used     Comment: ~ only about 4 cigs daily  . Alcohol Use: No    Review of Systems All other systems negative except as documented in the HPI. All pertinent positives and negatives as reviewed in the HPI.    Allergies  Amoxicillin and Penicillins  Home Medications   Prior to Admission medications   Medication Sig Start Date End Date Taking? Authorizing Provider  clonazePAM (KLONOPIN) 0.5 MG tablet TAKE 1 TABLET TWICE A DAY 03/18/15  Yes Dianne Dun, MD  cloNIDine (CATAPRES) 0.1 MG tablet TAKE 1 TABLET BY MOUTH EVERY DAY 07/07/14  Yes Dianne Dun, MD  ibuprofen (ADVIL,MOTRIN) 800 MG tablet Take 1 tablet (800 mg total) by  mouth 3 (three) times daily. 09/28/14  Yes Glynn Octave, MD  doxycycline (VIBRAMYCIN) 100 MG capsule Take 1 capsule (100 mg total) by mouth 2 (two) times daily. 03/21/15   Charlestine Night, PA-C  oxyCODONE-acetaminophen (PERCOCET/ROXICET) 5-325 MG per tablet Take 1 tablet by mouth every 6 (six) hours as needed for severe pain. 03/21/15   Jacqualyn Sedgwick, PA-C   BP 111/69 mmHg  Pulse 102  Temp(Src) 99.7 F (37.6 C) (Oral)  Resp 20  Ht  (1.702 m)  Wt 120 lb (54.432 kg)  BMI 18.79 kg/m2  SpO2 97% Physical Exam  Constitutional: He is oriented to person, place, and time. He appears well-developed and well-nourished. No distress.  HENT:  Head: Normocephalic and atraumatic.  Mouth/Throat: Oropharynx is clear and moist.  Eyes: Pupils are equal, round, and reactive to light.  Neck: Normal range of motion. Neck supple.  Cardiovascular: Normal rate, regular rhythm and normal heart sounds.  Exam reveals no gallop and no friction rub.   No murmur heard. Pulmonary/Chest: Effort normal and breath sounds normal. No respiratory distress. He has no wheezes.  Abdominal: Hernia confirmed negative in the right inguinal area.  Genitourinary: Penis normal.    Right testis shows swelling and tenderness. Right testis shows no mass. Right testis is descended. Cremasteric reflex is not absent on the right side. Left testis shows no mass, no swelling and no tenderness. Left testis is descended. Cremasteric reflex is not  absent on the left side. Circumcised. No paraphimosis, penile erythema or penile tenderness. No discharge found.  Musculoskeletal: He exhibits no edema.  Lymphadenopathy:       Right: No inguinal adenopathy present.  Neurological: He is alert and oriented to person, place, and time. He exhibits normal muscle tone. Coordination normal.  Skin: Skin is warm and dry. No rash noted. No erythema.    ED Course  Procedures (including critical care time) Labs Review Labs Reviewed    URINALYSIS, ROUTINE W REFLEX MICROSCOPIC (NOT AT [facility]) - Abnormal; Notable for the following:    Color, Urine AMBER (*)    APPearance TURBID (*)    Hgb urine dipstick LARGE (*)    Ketones, ur 15 (*)    Protein, ur 100 (*)    Nitrite POSITIVE (*)    Leukocytes, UA LARGE (*)    All other components within normal limits  BASIC METABOLIC PANEL - Abnormal; Notable for the following:    Sodium 133 (*)    Chloride 96 (*)    Glucose, Bld 121 (*)    All other components within normal limits  CBC WITH DIFFERENTIAL/PLATELET - Abnormal; Notable for the following:    WBC 12.6 (*)    MCHC 36.5 (*)    Neutrophils Relative % 79 (*)    Neutro Abs 9.9 (*)    Lymphocytes Relative 10 (*)    Monocytes Absolute 1.4 (*)    All other components within normal limits  URINE MICROSCOPIC-ADD ON - Abnormal; Notable for the following:    Squamous Epithelial / LPF FEW (*)    Bacteria, UA MANY (*)    All other components within normal limits  URINE CULTURE     Patient is advised to call urology as soon as possible for an appointment.  Told to return here for any worsening in his condition.  Patient agrees the plan.  All questions were answered. Patient will be treated for epididymitis based on his PE and [place] results. Patient was given rocephin iv here in the ER. Placed on Doxycycline as well.   Charlestine Night, PA-C 03/25/15 1816  Rolan Bucco, MD 03/25/15 [phone]

## 2015-03-25 ENCOUNTER — Telehealth (HOSPITAL_BASED_OUTPATIENT_CLINIC_OR_DEPARTMENT_OTHER): Payer: Self-pay | Admitting: Emergency Medicine

## 2015-03-25 ENCOUNTER — Inpatient Hospital Stay (HOSPITAL_COMMUNITY)
Admission: EM | Admit: 2015-03-25 | Discharge: 2015-03-26 | DRG: 728 | Disposition: A | Discharge status: Home / Self Care | Payer: Self-pay | Attending: Internal Medicine | Admitting: Internal Medicine

## 2015-03-25 ENCOUNTER — Emergency Department (HOSPITAL_COMMUNITY): Payer: Self-pay

## 2015-03-25 ENCOUNTER — Encounter (HOSPITAL_COMMUNITY): Payer: Self-pay | Admitting: Emergency Medicine

## 2015-03-25 DIAGNOSIS — L0390 Cellulitis, unspecified: Secondary | ICD-10-CM

## 2015-03-25 DIAGNOSIS — I10 Essential (primary) hypertension: Secondary | ICD-10-CM | POA: Diagnosis present

## 2015-03-25 DIAGNOSIS — N451 Epididymitis: Principal | ICD-10-CM | POA: Diagnosis present

## 2015-03-25 DIAGNOSIS — Z720 Tobacco use: Secondary | ICD-10-CM

## 2015-03-25 DIAGNOSIS — I252 Old myocardial infarction: Secondary | ICD-10-CM

## 2015-03-25 DIAGNOSIS — F419 Anxiety disorder, unspecified: Secondary | ICD-10-CM | POA: Diagnosis present

## 2015-03-25 DIAGNOSIS — F17210 Nicotine dependence, cigarettes, uncomplicated: Secondary | ICD-10-CM | POA: Diagnosis present

## 2015-03-25 DIAGNOSIS — Z79899 Other long term (current) drug therapy: Secondary | ICD-10-CM

## 2015-03-25 DIAGNOSIS — K51911 Ulcerative colitis, unspecified with rectal bleeding: Secondary | ICD-10-CM | POA: Diagnosis present

## 2015-03-25 HISTORY — DX: Pneumonia, unspecified organism: J18.9

## 2015-03-25 HISTORY — DX: Personal history of peptic ulcer disease: Z87.11

## 2015-03-25 HISTORY — DX: Personal history of other diseases of the digestive system: Z87.19

## 2015-03-25 HISTORY — DX: Epididymitis: N45.1

## 2015-03-25 HISTORY — DX: Acute myocardial infarction, unspecified: I21.9

## 2015-03-25 LAB — CBC WITH DIFFERENTIAL/PLATELET
Basophils Absolute: 0.1 10*3/uL (ref 0.0–0.1)
Basophils Relative: 1 % (ref 0–1)
EOS ABS: 0.2 10*3/uL (ref 0.0–0.7)
Eosinophils Relative: 3 % (ref 0–5)
HEMATOCRIT: 37.7 % — AB (ref 39.0–52.0)
Hemoglobin: 13.5 g/dL (ref 13.0–17.0)
Lymphocytes Relative: 37 % (ref 12–46)
Lymphs Abs: 2.2 10*3/uL (ref 0.7–4.0)
MCH: 31.6 pg (ref 26.0–34.0)
MCHC: 35.8 g/dL (ref 30.0–36.0)
MCV: 88.3 fL (ref 78.0–100.0)
Monocytes Absolute: 0.7 10*3/uL (ref 0.1–1.0)
Monocytes Relative: 12 % (ref 3–12)
NEUTROS ABS: 2.7 10*3/uL (ref 1.7–7.7)
Neutrophils Relative %: 47 % (ref 43–77)
PLATELETS: 365 10*3/uL (ref 150–400)
RBC: 4.27 MIL/uL (ref 4.22–5.81)
RDW: 12.8 % (ref 11.5–15.5)
WBC: 5.9 10*3/uL (ref 4.0–10.5)

## 2015-03-25 LAB — URINE MICROSCOPIC-ADD ON

## 2015-03-25 LAB — BASIC METABOLIC PANEL
Anion gap: 12 (ref 5–15)
BUN: 12 mg/dL (ref 6–20)
CHLORIDE: 98 mmol/L — AB (ref 101–111)
CO2: 27 mmol/L (ref 22–32)
CREATININE: 1 mg/dL (ref 0.61–1.24)
Calcium: 9.3 mg/dL (ref 8.9–10.3)
GFR calc Af Amer: >60 mL/min (ref >60)
GFR calc non Af Amer: >60 mL/min (ref >60)
Glucose, Bld: 98 mg/dL (ref 65–99)
POTASSIUM: 3.7 mmol/L (ref 3.5–5.1)
Sodium: 137 mmol/L (ref 135–145)

## 2015-03-25 LAB — URINALYSIS, ROUTINE W REFLEX MICROSCOPIC
Glucose, UA: NEGATIVE mg/dL
KETONES UR: 15 mg/dL — AB
NITRITE: NEGATIVE
PH: 6 (ref 5.0–8.0)
PROTEIN: 30 mg/dL — AB
Specific Gravity, Urine: 1.015 (ref 1.005–1.030)
Urobilinogen, UA: 1 mg/dL (ref 0.0–1.0)

## 2015-03-25 MED ORDER — CEFTRIAXONE SODIUM IN DEXTROSE 20 MG/ML IV SOLN
1.0000 g | Freq: Once | INTRAVENOUS | Status: AC
  Administered 2015-03-25: 1 g via INTRAVENOUS
  Filled 2015-03-25: qty 50

## 2015-03-25 MED ORDER — CLONAZEPAM 0.5 MG PO TABS
0.5000 mg | ORAL_TABLET | Freq: Two times a day (BID) | ORAL | Status: DC
  Administered 2015-03-25 – 2015-03-26 (×2): 0.5 mg via ORAL
  Filled 2015-03-25 (×2): qty 1

## 2015-03-25 MED ORDER — ACETAMINOPHEN 325 MG PO TABS: 650.0000 mg | ORAL_TABLET | Freq: Four times a day (QID) | ORAL | Status: DC | PRN

## 2015-03-25 MED ORDER — NICOTINE 14 MG/24HR TD PT24
14.0000 mg | MEDICATED_PATCH | Freq: Once | TRANSDERMAL | Status: AC
  Administered 2015-03-25: 14 mg via TRANSDERMAL
  Filled 2015-03-25: qty 1

## 2015-03-25 MED ORDER — ONDANSETRON HCL 4 MG PO TABS: 4.0000 mg | ORAL_TABLET | Freq: Four times a day (QID) | ORAL | Status: DC | PRN

## 2015-03-25 MED ORDER — DOXYCYCLINE HYCLATE 100 MG IV SOLR
100.0000 mg | Freq: Two times a day (BID) | INTRAVENOUS | Status: DC
  Administered 2015-03-25: 100 mg via INTRAVENOUS
  Filled 2015-03-25 (×3): qty 100

## 2015-03-25 MED ORDER — HEPARIN SODIUM (PORCINE) 5000 UNIT/ML IJ SOLN
5000.0000 [IU] | Freq: Three times a day (TID) | INTRAMUSCULAR | Status: DC
  Administered 2015-03-25 – 2015-03-26 (×3): 5000 [IU] via SUBCUTANEOUS
  Filled 2015-03-25 (×4): qty 1

## 2015-03-25 MED ORDER — SENNOSIDES-DOCUSATE SODIUM 8.6-50 MG PO TABS
1.0000 | ORAL_TABLET | Freq: Every evening | ORAL | Status: DC | PRN
Start: 2015-03-25 — End: 2015-03-26

## 2015-03-25 MED ORDER — CEFTRIAXONE SODIUM 1 G IJ SOLR: 1.0000 g | INTRAMUSCULAR | Status: DC

## 2015-03-25 MED ORDER — ONDANSETRON HCL 4 MG/2ML IJ SOLN
4.0000 mg | Freq: Once | INTRAMUSCULAR | Status: AC
  Administered 2015-03-25: 4 mg via INTRAVENOUS
  Filled 2015-03-25: qty 2

## 2015-03-25 MED ORDER — NICOTINE 14 MG/24HR TD PT24
14.0000 mg | MEDICATED_PATCH | Freq: Every day | TRANSDERMAL | Status: DC
  Administered 2015-03-26: 14 mg via TRANSDERMAL
  Filled 2015-03-25: qty 1

## 2015-03-25 MED ORDER — SODIUM CHLORIDE 0.9 % IV SOLN
INTRAVENOUS | Status: DC
  Administered 2015-03-25 – 2015-03-26 (×2): via INTRAVENOUS

## 2015-03-25 MED ORDER — SODIUM CHLORIDE 0.9 % IV SOLN
INTRAVENOUS | Status: DC
  Administered 2015-03-25: 12:00:00 via INTRAVENOUS

## 2015-03-25 MED ORDER — IOHEXOL 300 MG/ML  SOLN
80.0000 mL | Freq: Once | INTRAMUSCULAR | Status: AC | PRN
  Administered 2015-03-25: 80 mL via INTRAVENOUS

## 2015-03-25 MED ORDER — SACCHAROMYCES BOULARDII 250 MG PO CAPS
250.0000 mg | ORAL_CAPSULE | Freq: Two times a day (BID) | ORAL | Status: DC
  Administered 2015-03-25 – 2015-03-26 (×2): 250 mg via ORAL
  Filled 2015-03-25 (×3): qty 1

## 2015-03-25 MED ORDER — HYDROMORPHONE HCL 1 MG/ML IJ SOLN
1.0000 mg | Freq: Once | INTRAMUSCULAR | Status: AC
  Administered 2015-03-25: 1 mg via INTRAVENOUS
  Filled 2015-03-25: qty 1

## 2015-03-25 MED ORDER — ACETAMINOPHEN 650 MG RE SUPP: 650.0000 mg | Freq: Four times a day (QID) | RECTAL | Status: DC | PRN

## 2015-03-25 MED ORDER — NICOTINE 14 MG/24HR TD PT24
14.0000 mg | MEDICATED_PATCH | Freq: Every day | TRANSDERMAL | Status: DC
  Filled 2015-03-25: qty 1

## 2015-03-25 MED ORDER — ONDANSETRON HCL 4 MG/2ML IJ SOLN: 4.0000 mg | Freq: Four times a day (QID) | INTRAMUSCULAR | Status: DC | PRN

## 2015-03-25 MED ORDER — OXYCODONE HCL 5 MG PO TABS
5.0000 mg | ORAL_TABLET | ORAL | Status: DC | PRN
  Administered 2015-03-25 – 2015-03-26 (×5): 5 mg via ORAL
  Filled 2015-03-25 (×5): qty 1

## 2015-03-25 MED ORDER — CLONIDINE HCL 0.1 MG PO TABS
0.1000 mg | ORAL_TABLET | Freq: Every day | ORAL | Status: DC
  Administered 2015-03-26: 0.1 mg via ORAL
  Filled 2015-03-25: qty 1

## 2015-03-25 MED ORDER — KETOROLAC TROMETHAMINE 30 MG/ML IJ SOLN
30.0000 mg | Freq: Four times a day (QID) | INTRAMUSCULAR | Status: DC
  Administered 2015-03-25 – 2015-03-26 (×4): 30 mg via INTRAVENOUS
  Filled 2015-03-25 (×7): qty 1

## 2015-03-25 MED ORDER — DOXYCYCLINE HYCLATE 100 MG IV SOLR
100.0000 mg | Freq: Once | INTRAVENOUS | Status: AC
  Administered 2015-03-25: 100 mg via INTRAVENOUS
  Filled 2015-03-25: qty 100

## 2015-03-25 MED ORDER — ENSURE ENLIVE PO LIQD: 237.0000 mL | Freq: Two times a day (BID) | ORAL | Status: DC

## 2015-03-25 MED ORDER — HYDROMORPHONE HCL 1 MG/ML IJ SOLN
0.5000 mg | Freq: Once | INTRAMUSCULAR | Status: AC
  Administered 2015-03-25: 0.5 mg via INTRAVENOUS
  Filled 2015-03-25: qty 1

## 2015-03-25 NOTE — Discharge Instructions (Addendum)
1. Soaking in a tub of hot water will help your testicle feel better  2. Wear tight briefs-this will help your comfort level as well  3. Take all of your anti-biotics until done  4. I think you will find taken an anti-inflammatory will help your pain immensely.  5. I would recommend that you follow-up in our office- urology,  in about one month.

## 2015-03-25 NOTE — ED Provider Notes (Signed)
CSN: [id number]     Arrival date & time 03/25/15  1019 History   First MD Initiated Contact with Patient 03/25/15 1038     Chief Complaint  Patient presents with  . Testicle Pain     (Consider location/radiation/quality/duration/timing/severity/associated sxs/prior Treatment) HPI   PCP: Ruthe Mannan, MD Blood pressure 109/71, pulse 86, temperature 98.4 F (36.9 C), temperature source Oral, resp. rate 18, height  (1.702 m), weight 120 lb (54.432 kg), SpO2 98 %.  Todd Hanna is a 29 y.o.male with a significant PMH of attention, anxiety, ulcerative colitis, nephrolithiasis presents to the ER with complaints of stick your pain and swelling. Patient was seen on June 18 and had ultrasound of the scrotum with Dopplers performed which showed epididymitis. The patient states his swelling and pain are worse. He reports his testicles are now red and skin to the area flaking. He is having some pain near the perineal region, streaking of redness up his penis and some inguinal pain. He reports vomiting due to pain. He is given at about it so the 18th and has been taking the prescription that he was provided. Eyes fevers, chills, abdominal pain, rectal bleeding. He does endorse some urinary hesitancy with some mild hematuria.    The patient denies diaphoresis, fever, headache, weakness (general or focal), confusion, change of vision,  neck pain, dysphagia, aphagia, chest pain, shortness of breath,  back pain, abdominal pains, nausea, vomiting, diarrhea, lower extremity swelling, rash.   Past Medical History  Diagnosis Date  . Hypertension   . Anxiety   . Ulcerative colitis 2011    colonoscopy at [facility]  . Nephrolithiasis    Past Surgical History  Procedure Laterality Date  . Lithotripsy  2011   Family History  Problem Relation Age of Onset  . Hypertension Mother    History  Substance Use Topics  . Smoking status: Current Every Day Smoker -- 0.25 packs/day    Types: Cigarettes  .  Smokeless tobacco: Never Used     Comment: ~ only about 4 cigs daily  . Alcohol Use: No    Review of Systems  10 Systems reviewed and are negative for acute change except as noted in the HPI.   Allergies  Amoxicillin and Penicillins  Home Medications   Prior to Admission medications   Medication Sig Start Date End Date Taking? Authorizing Provider  clonazePAM (KLONOPIN) 0.5 MG tablet TAKE 1 TABLET TWICE A DAY 03/18/15  Yes Dianne Dun, MD  cloNIDine (CATAPRES) 0.1 MG tablet TAKE 1 TABLET BY MOUTH EVERY DAY 07/07/14  Yes Dianne Dun, MD  doxycycline (VIBRAMYCIN) 100 MG capsule Take 1 capsule (100 mg total) by mouth 2 (two) times daily. 03/21/15  Yes Christopher Lawyer, PA-C  ibuprofen (ADVIL,MOTRIN) 800 MG tablet Take 1 tablet (800 mg total) by mouth 3 (three) times daily. 09/28/14  Yes Glynn Octave, MD  oxyCODONE-acetaminophen (PERCOCET/ROXICET) 5-325 MG per tablet Take 1 tablet by mouth every 6 (six) hours as needed for severe pain. Patient not taking: Reported on 03/25/2015 03/21/15   Charlestine Night, PA-C   BP 118/84 mmHg  Pulse 79  Temp(Src) 98.4 F (36.9 C) (Oral)  Resp 18  Ht  (1.702 m)  Wt 120 lb (54.432 kg)  BMI 18.79 kg/m2  SpO2 96% Physical Exam  Constitutional: He appears well-developed and well-nourished. No distress.  HENT:  Head: Normocephalic and atraumatic.  Eyes: Pupils are equal, round, and reactive to light.  Neck: Normal range of motion. Neck supple.  Cardiovascular: Normal rate and regular rhythm.   Pulmonary/Chest: Effort normal.  Abdominal: Soft.  Genitourinary:  Left testicle is erythematous but there is no swelling and no tenderness. Right testicle is swollen, there is firmness to the distal portion, erythema and mild induration. Significant tenderness to palpation. He is also tenderness to palpation of the perineum. The patient also has some erythema to the shaft of his penis mild right inguinal tenderness.  Neurological: He is alert.   Skin: Skin is warm and dry.  Nursing note and vitals reviewed.   ED Course  Procedures (including critical care time) Labs Review Labs Reviewed  CBC WITH DIFFERENTIAL/PLATELET - Abnormal; Notable for the following:    HCT 37.7 (*)    All other components within normal limits  BASIC METABOLIC PANEL - Abnormal; Notable for the following:    Chloride 98 (*)    All other components within normal limits  URINALYSIS, ROUTINE W REFLEX MICROSCOPIC (NOT AT [facility]) - Abnormal; Notable for the following:    APPearance CLOUDY (*)    Hgb urine dipstick MODERATE (*)    Bilirubin Urine SMALL (*)    Ketones, ur 15 (*)    Protein, ur 30 (*)    Leukocytes, UA LARGE (*)    All other components within normal limits  URINE MICROSCOPIC-ADD ON - Abnormal; Notable for the following:    Bacteria, UA FEW (*)    Casts HYALINE CASTS (*)    All other components within normal limits  URINE CULTURE  GC/CHLAMYDIA PROBE AMP ([place]) NOT AT [facility]    Imaging Review Ct Pelvis W Contrast  03/25/2015   CLINICAL DATA:  Diagnosed with epididymitis on Saturday for pain and swelling in the right epididymis. On antibiotics. No improved, with worsening pain and swelling.  EXAM: CT PELVIS WITH CONTRAST  TECHNIQUE: Multidetector CT imaging of the pelvis was performed using the standard protocol following the bolus administration of intravenous contrast.  CONTRAST:  4mL OMNIPAQUE IOHEXOL 300 MG/ML  SOLN  COMPARISON:  Ultrasound 03/21/2015  FINDINGS: Visualized large and small bowel are unremarkable. Visualized aorta and iliac vessels normal caliber. No free fluid, free air or adenopathy. Urinary bladder decompressed, grossly unremarkable. Appendix is not visualized. No inflammatory process in the right lower quadrant.  There appears to be enhancement of the right epididymis. Asymmetric enhancement also noted within the right seminal vesicle. This could be related to hyperemia and infection. Possible small bilateral hydroceles  although difficult to confirm by CT.  No acute bony abnormality.  IMPRESSION: There appears to be hyperemia and enhancement of the right seminal vesicle and right epididymis which may be related to infection/inflammation.  Appendix not visualized, but no inflammatory process seen in the right lower quadrant.   Electronically Signed   By: Charlett Nose M.D.   On: 03/25/2015 12:29     EKG Interpretation None      MDM   Final diagnoses:  Epididymitis  Cellulitis, unspecified cellulitis site, unspecified extremity site, unspecified laterality    CT scan does not show any sign of Fournier's. I spoke with Dr. Kearney Hard the radiologist who denies any stranding or infection up into the pelvic area. She appears to have cellulitis to the right testicle along with epididymitis that has been resistant to oral medications.   Medications  0.9 %  sodium chloride infusion ( Intravenous New Bag/Given 03/25/15 1142)  doxycycline (VIBRAMYCIN) 100 mg in dextrose 5 % 250 mL IVPB (not administered)  HYDROmorphone (DILAUDID) injection 1 mg (1 mg Intravenous  Given 03/25/15 1113)  ondansetron (ZOFRAN) injection 4 mg (4 mg Intravenous Given 03/25/15 1113)  iohexol (OMNIPAQUE) 300 MG/ML solution 80 mL (80 mLs Intravenous Contrast Given 03/25/15 1205)  HYDROmorphone (DILAUDID) injection 0.5 mg (0.5 mg Intravenous Given 03/25/15 1258)    Discussed the case with Dr. Clarene Duke and the patient he will require admission for IV medications and Urology consult. Urine culture sent out as well as GC cultures.  The patient is aware of the CT scan findings and plan for admission and IV abx. He is agreeable. I spoke with Dr. Ardyth Harps with [facility] who has agreed to admit, inpatient, Med-surg floor  Urology: Dr. Hillis Range has agreed to consult on patient- spoke with him at 2pm    Todd Pel, PA-C 03/25/15 1403  Samuel Jester, DO 03/29/15 1346

## 2015-03-25 NOTE — ED Notes (Signed)
The patient is aware that a urine specimen is needed. 

## 2015-03-25 NOTE — ED Notes (Signed)
Attempted report 

## 2015-03-25 NOTE — H&P (Signed)
[facility]          History and Physical    PCP:   Arnette Norris, MD   EDP: Verdene Rio, PA  Chief Complaint:  Testicle swelling and pain  HPI: Patient is a 29 year old young man with past medical history significant for hypertension and tobacco abuse who was seen in the emergency department on 6/18 for testicular pain and swelling and had an ultrasound that was consistent with epididymitis. He was given a shot of Rocephin and sent home on doxycycline. He returns today because he continues to have increasing pain and erythema to the testicular area now extending into his penis and perineal areas. CT scan in the emergency department shows hyperemia and enhancement of the right seminal vesicle and right epididymis which may be related to infection/inflammation. He is afebrile and without leukocytosis, urology consult with Dr. Eulogio Ditch has been obtained and we have been asked to admit him for further evaluation and management.    Allergies:   Allergies  Allergen Reactions  . Amoxicillin Hives  . Penicillins Hives      Past Medical History  Diagnosis Date  . Hypertension   . Anxiety   . Ulcerative colitis 2011    colonoscopy at [facility]  . Nephrolithiasis     Past Surgical History  Procedure Laterality Date  . Lithotripsy  2011    Prior to Admission medications   Medication Sig Start Date End Date Taking? Authorizing Provider  clonazePAM (KLONOPIN) 0.5 MG tablet TAKE 1 TABLET TWICE A DAY 03/18/15  Yes Lucille Passy, MD  cloNIDine (CATAPRES) 0.1 MG tablet TAKE 1 TABLET BY MOUTH EVERY DAY 07/07/14  Yes Lucille Passy, MD  doxycycline (VIBRAMYCIN) 100 MG capsule Take 1 capsule (100 mg total) by mouth 2 (two) times daily. 03/21/15  Yes Christopher Lawyer, PA-C  ibuprofen (ADVIL,MOTRIN) 800 MG tablet Take 1 tablet (800 mg total) by mouth 3 (three) times daily. 09/28/14  Yes Ezequiel Essex, MD  oxyCODONE-acetaminophen (PERCOCET/ROXICET) 5-325 MG per tablet Take 1  tablet by mouth every 6 (six) hours as needed for severe pain. Patient not taking: Reported on 03/25/2015 03/21/15   Dalia Heading, PA-C    Social History:  reports that he has been smoking Cigarettes.  He has been smoking about 0.25 packs per day. He has never used smokeless tobacco. He reports that he does not drink alcohol or use illicit drugs.  Family History  Problem Relation Age of Onset  . Hypertension Mother     Review of Systems:  Constitutional: Denies fever, chills, diaphoresis, appetite change and fatigue.  HEENT: Denies photophobia, eye pain, redness, hearing loss, ear pain, congestion, sore throat, rhinorrhea, sneezing, mouth sores, trouble swallowing, neck pain, neck stiffness and tinnitus.   Respiratory: Denies SOB, DOE, cough, chest tightness,  and wheezing.   Cardiovascular: Denies chest pain, palpitations and leg swelling.  Gastrointestinal: Denies nausea, vomiting, abdominal pain, diarrhea, constipation, blood in stool and abdominal distention.   Endocrine: Denies: hot or cold intolerance, sweats, changes in hair or nails, polyuria, polydipsia. Musculoskeletal: Denies myalgias, back pain, joint swelling, arthralgias and gait problem.  Skin: Denies pallor, rash and wound.  Neurological: Denies dizziness, seizures, syncope, weakness, light-headedness, numbness and headaches.  Hematological: Denies adenopathy. Easy bruising, personal or family bleeding history  Psychiatric/Behavioral: Denies suicidal ideation, mood changes, confusion, nervousness, sleep disturbance and agitation   Physical Exam: Blood pressure 116/78, pulse 72, temperature 98.4 F (36.9  C), temperature source Oral, resp. rate 18, height 5' 7" (1.702 m), weight 54.432 kg (120 lb), SpO2 100 %. General: Alert, awake, oriented 3 HEENT: Normocephalic, atraumatic, pupils equal round and reactive to light, extraocular movements intact. Neck: Supple, no JVD, no lymphadenopathy, no bruits, no  goiter. Cardiovascular: Regular rate and rhythm, no murmurs, rubs or gallops. Lungs: Clear to auscultation bilaterally. Abdomen: Soft, nontender, nondistended, positive bowel sounds, no masses or organomegaly noted. Genitourinary: Significant edema and erythema to bilateral testes, very painful to palpation, some extension posteriorly into the perineal area and to the underside of the penis. Extremities: No clubbing, cyanosis or edema, positive pulses. Neurologic: Grossly intact and nonfocal.  Labs on Admission:  Results for orders placed or performed during the hospital encounter of 03/25/15 (from the past 48 hour(s))  CBC with Differential/Platelet     Status: Abnormal   Collection Time: 03/25/15 11:10 AM  Result Value Ref Range   WBC 5.9 4.0 - 10.5 K/uL   RBC 4.27 4.22 - 5.81 MIL/uL   Hemoglobin 13.5 13.0 - 17.0 g/dL   HCT 37.7 (L) 39.0 - 52.0 %   MCV 88.3 78.0 - 100.0 fL   MCH 31.6 26.0 - 34.0 pg   MCHC 35.8 30.0 - 36.0 g/dL   RDW 12.8 11.5 - 15.5 %   Platelets 365 150 - 400 K/uL   Neutrophils Relative % 47 43 - 77 %   Lymphocytes Relative 37 12 - 46 %   Monocytes Relative 12 3 - 12 %   Eosinophils Relative 3 0 - 5 %   Basophils Relative 1 0 - 1 %   Neutro Abs 2.7 1.7 - 7.7 K/uL   Lymphs Abs 2.2 0.7 - 4.0 K/uL   Monocytes Absolute 0.7 0.1 - 1.0 K/uL   Eosinophils Absolute 0.2 0.0 - 0.7 K/uL   Basophils Absolute 0.1 0.0 - 0.1 K/uL   Smear Review LARGE PLATELETS PRESENT     Comment: MORPHOLOGY UNREMARKABLE  Basic metabolic panel     Status: Abnormal   Collection Time: 03/25/15 11:10 AM  Result Value Ref Range   Sodium 137 135 - 145 mmol/L   Potassium 3.7 3.5 - 5.1 mmol/L   Chloride 98 (L) 101 - 111 mmol/L   CO2 27 22 - 32 mmol/L   Glucose, Bld 98 65 - 99 mg/dL   BUN 12 6 - 20 mg/dL   Creatinine, Ser 1.00 0.61 - 1.24 mg/dL   Calcium 9.3 8.9 - 10.3 mg/dL   GFR calc non Af Amer >60 >60 mL/min   GFR calc Af Amer >60 >60 mL/min    Comment: (NOTE) The eGFR has been  calculated using the CKD EPI equation. This calculation has not been validated in all clinical situations. eGFR's persistently <60 mL/min signify possible Chronic Kidney Disease.    Anion gap 12 5 - 15  Urinalysis, Routine w reflex microscopic (not at [facility])     Status: Abnormal   Collection Time: 03/25/15 11:29 AM  Result Value Ref Range   Color, Urine YELLOW YELLOW   APPearance CLOUDY (A) CLEAR   Specific Gravity, Urine 1.015 1.005 - 1.030   pH 6.0 5.0 - 8.0   Glucose, UA NEGATIVE NEGATIVE mg/dL   Hgb urine dipstick MODERATE (A) NEGATIVE   Bilirubin Urine SMALL (A) NEGATIVE   Ketones, ur 15 (A) NEGATIVE mg/dL   Protein, ur 30 (A) NEGATIVE mg/dL   Urobilinogen, UA 1.0 0.0 - 1.0 mg/dL   Nitrite NEGATIVE NEGATIVE   Leukocytes, UA  LARGE (A) NEGATIVE  Urine microscopic-add on     Status: Abnormal   Collection Time: 03/25/15 11:29 AM  Result Value Ref Range   WBC, UA 21-50 <3 WBC/hpf   RBC / HPF 7-10 <3 RBC/hpf   Bacteria, UA FEW (A) RARE   Casts HYALINE CASTS (A) NEGATIVE   Urine-Other LESS THAN 10 mL OF URINE SUBMITTED     Comment: MICROSCOPIC EXAM PERFORMED ON UNCONCENTRATED URINE    Radiological Exams on Admission: Ct Pelvis W Contrast  03/25/2015   CLINICAL DATA:  Diagnosed with epididymitis on Saturday for pain and swelling in the right epididymis. On antibiotics. No improved, with worsening pain and swelling.  EXAM: CT PELVIS WITH CONTRAST  TECHNIQUE: Multidetector CT imaging of the pelvis was performed using the standard protocol following the bolus administration of intravenous contrast.  CONTRAST:  54m OMNIPAQUE IOHEXOL 300 MG/ML  SOLN  COMPARISON:  Ultrasound 03/21/2015  FINDINGS: Visualized large and small bowel are unremarkable. Visualized aorta and iliac vessels normal caliber. No free fluid, free air or adenopathy. Urinary bladder decompressed, grossly unremarkable. Appendix is not visualized. No inflammatory process in the right lower quadrant.  There appears to be  enhancement of the right epididymis. Asymmetric enhancement also noted within the right seminal vesicle. This could be related to hyperemia and infection. Possible small bilateral hydroceles although difficult to confirm by CT.  No acute bony abnormality.  IMPRESSION: There appears to be hyperemia and enhancement of the right seminal vesicle and right epididymis which may be related to infection/inflammation.  Appendix not visualized, but no inflammatory process seen in the right lower quadrant.   Electronically Signed   By: KRolm BaptiseM.D.   On: 03/25/2015 12:29    Assessment/Plan Principal Problem:   Epididymitis without abscess Active Problems:   Hypertension   Tobacco abuse   Epididymitis   Epididymitis -Confirmed on CT scan, there is possibly some surrounding cellulitis as well. -ED PA has discussed with urology who recommends continuing doxycycline and they will see patient in consultation (Dr. DEulogio Ditch for further recommendations. -Urine culture performed on 6/18 consistent with Escherichia coli that is sensitive to doxycycline.  Tobacco abuse  -Counseled on cessation. -We'll provide nicotine patch.  Hypertension -Currently well controlled. -Continue clonidine at home dose.  DVT prophylaxis -Subcutaneous heparin  CODE STATUS -Full code     Time Spent on Admission: 75 minutes  HERNANDEZ ACOSTA,ESTELA [facility] Pager: [phone]/22/2016, 3:05 PM

## 2015-03-25 NOTE — ED Notes (Signed)
Seen on Saturday for pain and swelling in testicle. Diagnosed with epididymitis and started on antibiotics. Has taken them as directed every day. No improvement, pain and swelling are both worse.

## 2015-03-25 NOTE — ED Notes (Signed)
Patient returned from CT

## 2015-03-25 NOTE — ED Notes (Signed)
Patient transported to CT 

## 2015-03-25 NOTE — Progress Notes (Signed)
NURSING PROGRESS NOTE  Todd Hanna [id number] Admission Data: 03/25/2015 6:59 PM Attending Provider: Micael Hampshire Acost* IRS:WNIOE [name], MD Code Status: Full  Allergies:  Amoxicillin and Penicillins Past Medical History:   has a past medical history of Hypertension; Anxiety; Ulcerative colitis (2011); Nephrolithiasis; Pneumonia (~ 2011); History of stomach ulcers; Myocardial infarction (2005); and Epididymitis, right (03/25/2015). Past Surgical History:   has past surgical history that includes Lithotripsy (2011) and Colonoscopy (2011). Social History:   reports that he has been smoking Cigarettes.  He has a 7.8 pack-year smoking history. He has never used smokeless tobacco. He reports that he drinks about 2.4 oz of alcohol per week. He reports that he does not use illicit drugs.  Todd Hanna is a 29 y.o. male patient admitted from ED:   Last Documented Vital Signs: Blood pressure 103/63, pulse 78, temperature 97.8 F (36.6 C), temperature source Oral, resp. rate 18, height 5\' 7"  (1.702 m), weight 52.5 kg (115 lb 11.9 oz), SpO2 100 %.  Cardiac Monitoring:  None ordered.  IV Fluids:  IV in place, occlusive dsg intact without redness, IV cath forearm right, condition patent and no redness normal saline.   Skin: WDL  Patient/Family orientated to room. Information packet given to patient/family. Admission inpatient armband information verified with patient/family to include name and date of birth and placed on patient arm. Side rails up x 2, fall assessment and education completed with patient/family. Patient/family able to verbalize understanding of risk associated with falls and verbalized understanding to call for assistance before getting out of bed. Call light within reach. Patient/family able to voice and demonstrate understanding of unit orientation instructions.    Will continue to evaluate and treat per MD orders.   Leane Platt RN, BS, BSN

## 2015-03-25 NOTE — Consult Note (Signed)
Urology Consult  Consulting MD: Isaac Bliss, M.D.  CC: Swollen right testicle  HPI: This is a 29 year old male without prior urologic history other than a prior history of one stone treated with lithotripsy 4 years ago, who presented to the emergency room 4 days ago with a several day history of dysuria, frequency, right testicular pain, swelling and fever. He was diagnosed with acute epididymitis. He was sent home with oxycodone as well as doxycycline. He has been taking his doxycycline as directed, twice a day. He has also been taking his oxycodone which is all been used up. Interestingly, he has not been placed on anti-inflammatory. Because of increasing pain, and the feeling that the testicle is increased in size, he re-presented to the emergency room. He was admitted earlier today for further management.  He did have a scrotal ultrasound on the 18th which revealed no evidence of testicular torsion. It did show a small hyperechoic area in the right testicle and it was recommended that it be followed up in several months. Urine culture grew pansensitive Escherichia coli.  PMH: Past Medical History  Diagnosis Date  . Hypertension   . Anxiety   . Ulcerative colitis 2011    colonoscopy at [facility]  . Nephrolithiasis   . Pneumonia ~ 2011  . History of stomach ulcers   . Myocardial infarction 2005    "overdosed on caffeine"  . Epididymitis, right 03/25/2015    PSH: Past Surgical History  Procedure Laterality Date  . Lithotripsy  2011  . Colonoscopy  2011    dx'd ulcerative colitis    Allergies: Allergies  Allergen Reactions  . Amoxicillin Hives  . Penicillins Hives    Medications: Prescriptions prior to admission  Medication Sig Dispense Refill Last Dose  . clonazePAM (KLONOPIN) 0.5 MG tablet TAKE 1 TABLET TWICE A DAY 60 tablet 0 03/25/2015 at Unknown time  . cloNIDine (CATAPRES) 0.1 MG tablet TAKE 1 TABLET BY MOUTH EVERY DAY 30 tablet 10 03/25/2015 at Unknown time  .  doxycycline (VIBRAMYCIN) 100 MG capsule Take 1 capsule (100 mg total) by mouth 2 (two) times daily. 28 capsule 0 03/25/2015 at Unknown time  . ibuprofen (ADVIL,MOTRIN) 800 MG tablet Take 1 tablet (800 mg total) by mouth 3 (three) times daily. 21 tablet 0 03/25/2015 at Unknown time  . oxyCODONE-acetaminophen (PERCOCET/ROXICET) 5-325 MG per tablet Take 1 tablet by mouth every 6 (six) hours as needed for severe pain. (Patient not taking: Reported on 03/25/2015) 15 tablet 0 Completed Course at Unknown time     Social History: History   Social History  . Marital Status: Single    Spouse Name: N/A  . Number of Children: 1  . Years of Education: N/A   Occupational History  . 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 Smoking status: Current Every Day Smoker -- 0.60 packs/day for 13 years    Types: Cigarettes  . Smokeless tobacco: Never Used  . Alcohol Use: 2.4 oz/week    4 Cans of beer per week  . Drug Use: No  . Sexual Activity: Yes   Other Topics Concern  . Not on file   Social History Narrative    Family History: Family History  Problem Relation Age of Onset  . Hypertension Mother     Review of Systems: Positive: Right testicular swelling, pain, fever, redness of the scrotal skin Negative: .  A further 10 point review of systems was negative except what is listed in the HPI.  Physical Exam: @VITALS2 @ General: No acute distress.  Awake. Head:  Normocephalic.  Atraumatic. ENT:  EOMI.  Mucous membranes moist Neck:  Supple.  No lymphadenopathy. CV:  S1 present. S2 present. Regular rate. Pulmonary: Equal effort bilaterally.  Clear to auscultation bilaterally. Abdomen: SofNon- tender to palpation. Skin:  Normal turgor.  No visible rash. Extremity: No gross deformity of bilateral upper extremities.  No gross deformity of    bilateral lower extremities. Neurologic: Alert. Appropriate mood.  Penis:  circumcised.  No lesions. Urethra:   Orthotopic  meatus. Scrotum: No lesions.  No ecchymosis.  Mildly erythematous, right greater than left. No crepitus. No fluctuance noted.  Testicles: Descended bilaterally.  No masses bilaterally. right testicle is approximately twice its normal size, quite tender. Cremasteric reflex normal.  Epididymis: Palpable bilaterally.  right epididymis enlarged, tender   Studies:  Recent Labs     03/25/15  1110  HGB  13.5  WBC  5.9  PLT  365    Recent Labs     03/25/15  1110  NA  137  K  3.7  CL  98*  CO2  27  BUN  12  CREATININE  1.00  CALCIUM  9.3  GFRNONAA  >60  GFRAA  >60     No results for input(s): INR, APTT in the last 72 hours.  Invalid input(s): PT   Invalid input(s): ABG     Impression: Right-sided epididymitis. Based on his culture showing pansensitive Escherichia coli, ultrasonographic findings of a normal testicle without evidence of torsion or abscess, and my exam today revealing mild epididymitis, I think he has been appropriately treated this far, except for him not having been treated with anti-inflammatory.  Plan: 1. I assured the patient and his mother that I think the plan of care so far has been appropriate, except for the omission of anti-inflammatory  2. Continue doxycycline intravenously currently-I think that this can be changed to by mouth fairly soon. I will add 1 more dose of Rocephin  3. I will add Toradol-I think this will immensely helped the patient's pain  4. Seeing that he has ulcerative colitis, I will also order a probiotic.  5. I would recommend that he follow-up in our office in about a month for repeat testicular exam. I think that sending him home with another 7 days of doxycycline would be appropriate, as well as explicit instructions for sitting in a hot tub once or twice a day, wearing tight brief, and taking anti-inflammatory is. I told him that he should expect the pain to dissipate before the swelling.  6. At this point, I will sign off. I  will make sure discharge instructions are well spelled out, as well as follow-up with our office. If further instructions needed prior to the patient's discharge, please let me know.   Pager:[phone]

## 2015-03-25 NOTE — Telephone Encounter (Signed)
Post ED Visit - Positive Culture Follow-up  Culture report reviewed by antimicrobial stewardship pharmacist: []  Wes Dulaney, Pharm.D., BCPS []  Heide Guile, Pharm.D., BCPS [x]  Alycia Rossetti, Pharm.D., BCPS []  [place], Pharm.D., BCPS, AAHIVP []  Legrand Como, Pharm.D., BCPS, AAHIVP []  Isac Sarna, Pharm.D., BCPS  Positive urine  Culture E.Coli Treated with doxycycline, organism sensitive to the same and no further patient follow-up is required at this time.  Hazle Nordmann 03/25/2015, 2:52 PM

## 2015-03-26 DIAGNOSIS — I10 Essential (primary) hypertension: Secondary | ICD-10-CM

## 2015-03-26 DIAGNOSIS — N451 Epididymitis: Principal | ICD-10-CM

## 2015-03-26 DIAGNOSIS — Z720 Tobacco use: Secondary | ICD-10-CM

## 2015-03-26 LAB — BASIC METABOLIC PANEL
ANION GAP: 7 (ref 5–15)
BUN: 11 mg/dL (ref 6–20)
CHLORIDE: 101 mmol/L (ref 101–111)
CO2: 31 mmol/L (ref 22–32)
Calcium: 9 mg/dL (ref 8.9–10.3)
Creatinine, Ser: 1 mg/dL (ref 0.61–1.24)
GFR calc non Af Amer: >60 mL/min (ref >60)
Glucose, Bld: 91 mg/dL (ref 65–99)
Potassium: 3.7 mmol/L (ref 3.5–5.1)
Sodium: 139 mmol/L (ref 135–145)

## 2015-03-26 LAB — CBC
HCT: 37.6 % — ABNORMAL LOW (ref 39.0–52.0)
Hemoglobin: 13 g/dL (ref 13.0–17.0)
MCH: 31.3 pg (ref 26.0–34.0)
MCHC: 34.6 g/dL (ref 30.0–36.0)
MCV: 90.4 fL (ref 78.0–100.0)
Platelets: 371 10*3/uL (ref 150–400)
RBC: 4.16 MIL/uL — ABNORMAL LOW (ref 4.22–5.81)
RDW: 13.1 % (ref 11.5–15.5)
WBC: 6.9 10*3/uL (ref 4.0–10.5)

## 2015-03-26 MED ORDER — NICOTINE 14 MG/24HR TD PT24: 14.0000 mg | MEDICATED_PATCH | Freq: Once | TRANSDERMAL | Status: DC

## 2015-03-26 MED ORDER — ENSURE ENLIVE PO LIQD: 237.0000 mL | Freq: Two times a day (BID) | ORAL | Status: DC

## 2015-03-26 MED ORDER — DOXYCYCLINE HYCLATE 100 MG PO TABS
100.0000 mg | ORAL_TABLET | Freq: Two times a day (BID) | ORAL | Status: DC
  Filled 2015-03-26: qty 1

## 2015-03-26 MED ORDER — OXYCODONE HCL 5 MG PO TABS: 5.0000 mg | ORAL_TABLET | ORAL | Status: DC | PRN

## 2015-03-26 MED ORDER — DOXYCYCLINE HYCLATE 100 MG PO TABS: 100.0000 mg | ORAL_TABLET | Freq: Two times a day (BID) | ORAL | Status: DC

## 2015-03-26 MED ORDER — DEXTROSE 5 % IV SOLN
100.0000 mg | Freq: Once | INTRAVENOUS | Status: AC
  Administered 2015-03-26: 100 mg via INTRAVENOUS
  Filled 2015-03-26: qty 100

## 2015-03-26 MED ORDER — SACCHAROMYCES BOULARDII 250 MG PO CAPS: 250.0000 mg | ORAL_CAPSULE | Freq: Two times a day (BID) | ORAL | Status: DC

## 2015-03-26 NOTE — Progress Notes (Signed)
Pt does not currently have insurance and abx cost $75 out of pocket. Appointment made at [facility]. Patient received pamphlet from [facility] CM explained to patient that they may use the on site pharmacy to fill prescriptions given to them at discharge. Patient aware that the [facility] pharmacy will not fill narcotics or pain medications prior to the patient being seen by one of their physicians.  Patient aware that they must be seen as a patient prior to the pharmacy filling the prescriptions a second time.

## 2015-03-26 NOTE — Progress Notes (Signed)
IV abx completed patient has called ride, patient has contacted his family, waiting on patient to tell me when his family will be coming to pick him up.

## 2015-03-26 NOTE — Progress Notes (Signed)
Utilization Review completed. Nzinga Ferran RN BSN CM 

## 2015-03-26 NOTE — Progress Notes (Signed)
Patient to be dc'd upon completion of iv abx.

## 2015-03-26 NOTE — Discharge Summary (Signed)
Physician Discharge Summary  Todd Hanna JSE:[id number] DOB: May 28, 1986 DOA: 03/25/2015  PCP: Arnette Norris, MD  Admit date: 03/25/2015 Discharge date: 03/26/2015  Time spent: 45 minutes  Recommendations for Outpatient Follow-up:  Patient will be discharged to home.  Patient will need to follow up with primary care provider within one week of discharge.  Patient will also need a follow-up with Dr. Diona Fanti, urology, within one month.  Patient should continue medications as prescribed.  Patient should follow a heart heathy diet.  Patient counseled on tobacco cessation.   Discharge Diagnoses:  Principal Problem:   Epididymitis without abscess Active Problems:   Hypertension   Tobacco abuse   Epididymitis   Discharge Condition: Stable  Diet recommendation: heart heathy  Filed Weights   03/25/15 1046 03/25/15 1643  Weight: 54.432 kg (120 lb) 52.5 kg (115 lb 11.9 oz)    History of present illness:  On 03/25/2015 by Dr. Lelon Frohlich Patient is a 29 year old young man with past medical history significant for hypertension and tobacco abuse who was seen in the emergency department on 6/18 for testicular pain and swelling and had an ultrasound that was consistent with epididymitis. He was given a shot of Rocephin and sent home on doxycycline. He returns today because he continues to have increasing pain and erythema to the testicular area now extending into his penis and perineal areas. CT scan in the emergency department shows hyperemia and enhancement of the right seminal vesicle and right epididymis which may be related to infection/inflammation. He is afebrile and without leukocytosis, urology consult with Dr. Eulogio Ditch has been obtained and we have been asked to admit him for further evaluation and management.   Hospital Course:  Epididymitis -Confirmed on CT scan, there is possibly some surrounding cellulitis as well. -Urology consulted and appreicated- reocmmended  additional dose of Rocephin, Toradol, doxycycline for an additional week, outpatient follow-up. Also recommended that patient sit in a hot time once or twice per day, wear tight briefs, use anti-inflammatory medications. Should also use probiotics given history of ulcerative colitis. -Urine culture performed on 6/18 consistent with Escherichia coli that is sensitive to doxycycline. -Will discharge patient with additional 7 days of doxycycline as well as florastor, limited number of oxycodone -Pending chlamydia/gonorrhea urine probe as well as HIV, these can be followed up by primary care physician.  Tobacco abuse  -Counseled on cessation. -Continue nicotine patch  Hypertension -Currently well controlled. -Continue clonidine  Procedures: None  Consultations: Urology  Discharge Exam: Filed Vitals:   03/26/15 0638  BP: 99/62  Pulse: 70  Temp: 97.7 F (36.5 C)  Resp: 18     General: Well developed, well nourished, NAD, appears stated age  HEENT: NCAT, mucous membranes moist.  Cardiovascular: S1 S2 auscultated, no rubs, murmurs or gallops. Regular rate and rhythm.  Respiratory: Clear to auscultation bilaterally with equal chest rise  Abdomen: Soft, nontender, nondistended, + bowel sounds  Genitalia: Right testicle/epididymis appears to be tender and larger than the left. No penile discharge.  Extremities: warm dry without cyanosis clubbing or edema  Neuro: AAOx3, cranial nerves grossly intact. Strength 5/5 in patient's upper and lower extremities bilaterally  Skin: Without rashes exudates or nodules  Psych: Normal affect and demeanor with intact judgement and insight  Discharge Instructions      Discharge Instructions    Discharge instructions    Complete by:  As directed   Patient will be discharged to home.  Patient will need to follow up with primary care provider  within one week of discharge.  Patient will also need a follow-up with Dr. Diona Fanti, urology,  within one month.  Patient should continue medications as prescribed.  Patient should follow a heart heathy diet.  Patient counseled on tobacco cessation.            Medication List    STOP taking these medications        doxycycline 100 MG capsule  Commonly known as:  VIBRAMYCIN  Replaced by:  doxycycline 100 MG tablet     oxyCODONE-acetaminophen 5-325 MG per tablet  Commonly known as:  PERCOCET/ROXICET      TAKE these medications        clonazePAM 0.5 MG tablet  Commonly known as:  KLONOPIN  TAKE 1 TABLET TWICE A DAY     cloNIDine 0.1 MG tablet  Commonly known as:  CATAPRES  TAKE 1 TABLET BY MOUTH EVERY DAY     doxycycline 100 MG tablet  Commonly known as:  VIBRA-TABS  Take 1 tablet (100 mg total) by mouth every 12 (twelve) hours.     feeding supplement (ENSURE ENLIVE) Liqd  Take 237 mLs by mouth 2 (two) times daily between meals.     ibuprofen 800 MG tablet  Commonly known as:  ADVIL,MOTRIN  Take 1 tablet (800 mg total) by mouth 3 (three) times daily.     nicotine 14 mg/24hr patch  Commonly known as:  NICODERM CQ - dosed in mg/24 hours  Place 1 patch (14 mg total) onto the skin once.     oxyCODONE 5 MG immediate release tablet  Commonly known as:  Oxy IR/ROXICODONE  Take 1 tablet (5 mg total) by mouth every 4 (four) hours as needed for moderate pain.     saccharomyces boulardii 250 MG capsule  Commonly known as:  FLORASTOR  Take 1 capsule (250 mg total) by mouth 2 (two) times daily.       Allergies  Allergen Reactions  . Amoxicillin Hives  . Penicillins Hives   Follow-up Information    Follow up with DAHLSTEDT, Lillette Boxer, MD In 1 month.   Specialty:  Urology   Why:  Hospital follow up   Contact information:   [place] [address] [phone]       Follow up with Arnette Norris, MD. Schedule an appointment as soon as possible for a visit in 1 week.   Specialty:  Family Medicine   Why:  Hospital follow up   Contact information:   [place] [address] [phone]        The results of significant diagnostics from this hospitalization (including imaging, microbiology, ancillary and laboratory) are listed below for reference.    Significant Diagnostic Studies: Ct Pelvis W Contrast  03/25/2015   CLINICAL DATA:  Diagnosed with epididymitis on Saturday for pain and swelling in the right epididymis. On antibiotics. No improved, with worsening pain and swelling.  EXAM: CT PELVIS WITH CONTRAST  TECHNIQUE: Multidetector CT imaging of the pelvis was performed using the standard protocol following the bolus administration of intravenous contrast.  CONTRAST:  76m OMNIPAQUE IOHEXOL 300 MG/ML  SOLN  COMPARISON:  Ultrasound 03/21/2015  FINDINGS: Visualized large and small bowel are unremarkable. Visualized aorta and iliac vessels normal caliber. No free fluid, free air or adenopathy. Urinary bladder decompressed, grossly unremarkable. Appendix is not visualized. No inflammatory process in the right lower quadrant.  There appears to be enhancement of the right epididymis. Asymmetric enhancement also noted within  the right seminal vesicle. This could be related to hyperemia and infection. Possible small bilateral hydroceles although difficult to confirm by CT.  No acute bony abnormality.  IMPRESSION: There appears to be hyperemia and enhancement of the right seminal vesicle and right epididymis which may be related to infection/inflammation.  Appendix not visualized, but no inflammatory process seen in the right lower quadrant.   Electronically Signed   By: Rolm Baptise M.D.   On: 03/25/2015 12:29   US Scrotum  03/21/2015   CLINICAL DATA:  Pain and swelling for 3 days  EXAM: ULTRASOUND OF SCROTUM AND SCROTAL DOPPLER.  TECHNIQUE: Complete ultrasound examination of the testicles, epididymis, and other scrotal structures was performed. Scrotal Doppler is also performed.  COMPARISON:  None.  FINDINGS: Right testicle  Measurements:  3.9 x 2.3 x 2.9 cm. No mass or microlithiasis visualized.  Left testicle  Measurements: 5.1 x 2.2 x 2.6 cm. No mass or microlithiasis visualized. Tiny hyperechoic focus adjacent to the lateral aspect of the left testicle is nonspecific. It measures 1.6 mm.  Right epididymis: 3.8 x 1.5 x 3.0 cm. The right epididymis is enlarged and hypervascular.  Left epididymis:  Normal in size and appearance.  Hydrocele:  Negative.  Varicocele:  Bilateral mild varicocele is noted upon Valsalva.  Doppler examination: Normal-appearing arterial and venous Doppler waveforms are present within the parenchyma of both testicles.  IMPRESSION: No evidence of testicular torsion.  Findings consistent with right epididymitis.  Bilateral mild varicocele.  1.6 mm nonspecific hyperechoic focus along the rim of the left testicle. This is most likely benign. Consider six-month follow-up ultrasound to ensure stability and urological consultation.   Electronically Signed   By: Marybelle Killings M.D.   On: 03/21/2015 12:29   [place] Art/ven Flow Abd Pelv Doppler  03/21/2015   CLINICAL DATA:  Pain and swelling for 3 days  EXAM: ULTRASOUND OF SCROTUM AND SCROTAL DOPPLER.  TECHNIQUE: Complete ultrasound examination of the testicles, epididymis, and other scrotal structures was performed. Scrotal Doppler is also performed.  COMPARISON:  None.  FINDINGS: Right testicle  Measurements: 3.9 x 2.3 x 2.9 cm. No mass or microlithiasis visualized.  Left testicle  Measurements: 5.1 x 2.2 x 2.6 cm. No mass or microlithiasis visualized. Tiny hyperechoic focus adjacent to the lateral aspect of the left testicle is nonspecific. It measures 1.6 mm.  Right epididymis: 3.8 x 1.5 x 3.0 cm. The right epididymis is enlarged and hypervascular.  Left epididymis:  Normal in size and appearance.  Hydrocele:  Negative.  Varicocele:  Bilateral mild varicocele is noted upon Valsalva.  Doppler examination: Normal-appearing arterial and venous Doppler waveforms are present within the  parenchyma of both testicles.  IMPRESSION: No evidence of testicular torsion.  Findings consistent with right epididymitis.  Bilateral mild varicocele.  1.6 mm nonspecific hyperechoic focus along the rim of the left testicle. This is most likely benign. Consider six-month follow-up ultrasound to ensure stability and urological consultation.   Electronically Signed   By: Marybelle Killings M.D.   On: 03/21/2015 12:29    Microbiology: Recent Results (from the past 240 hour(s))  Urine culture     Status: None   Collection Time: 03/21/15 12:45 PM  Result Value Ref Range Status   Specimen Description URINE, RANDOM  Final   Special Requests CX ADDED AT 0109 ON [id number]  Final   Culture >=100,000 COLONIES/mL ESCHERICHIA COLI  Final   Report Status 03/24/2015 FINAL  Final   Organism ID, Bacteria ESCHERICHIA COLI  Final  Susceptibility   Escherichia coli - MIC*    AMPICILLIN <=2 SENSITIVE Sensitive     CEFTAZIDIME <=1 SENSITIVE Sensitive     CEFTRIAXONE <=1 SENSITIVE Sensitive     CIPROFLOXACIN <=0.25 SENSITIVE Sensitive     GENTAMICIN <=1 SENSITIVE Sensitive     IMIPENEM <=0.25 SENSITIVE Sensitive     NITROFURANTOIN <=16 SENSITIVE Sensitive     TRIMETH/SULFA <=20 SENSITIVE Sensitive     AMPICILLIN/SULBACTAM <=2 SENSITIVE Sensitive     PIP/TAZO <=4 SENSITIVE Sensitive     * >=100,000 COLONIES/mL ESCHERICHIA COLI     Labs: Basic Metabolic Panel:  Recent Labs Lab 03/21/15 1110 03/25/15 1110 03/26/15 0542  NA 133* 137 139  K 3.8 3.7 3.7  CL 96* 98* 101  CO2 25 27 31   GLUCOSE 121* 98 91  BUN 13 12 11   CREATININE 1.11 1.00 1.00  CALCIUM 8.9 9.3 9.0   Liver Function Tests: No results for input(s): AST, ALT, ALKPHOS, BILITOT, PROT, ALBUMIN in the last 168 hours. No results for input(s): LIPASE, AMYLASE in the last 168 hours. No results for input(s): AMMONIA in the last 168 hours. CBC:  Recent Labs Lab 03/21/15 1110 03/25/15 1110 03/26/15 0542  WBC 12.6* 5.9 6.9  NEUTROABS 9.9*  2.7  --   HGB 15.0 13.5 13.0  HCT 41.1 37.7* 37.6*  MCV 88.8 88.3 90.4  PLT 199 365 371   Cardiac Enzymes: No results for input(s): CKTOTAL, CKMB, CKMBINDEX, TROPONINI in the last 168 hours. BNP: BNP (last 3 results) No results for input(s): BNP in the last 8760 hours.  ProBNP (last 3 results) No results for input(s): PROBNP in the last 8760 hours.  CBG: No results for input(s): GLUCAP in the last 168 hours.     SignedCristal Ford  [facility] 03/26/2015, 10:37 AM

## 2015-03-26 NOTE — Progress Notes (Addendum)
NURSING PROGRESS NOTE  Todd Hanna [id number] Discharge Data: 03/26/2015 1:29 PM Attending Provider: Edsel Petrin, DO JFH:LKTGY [name], MD   Lucille Passy to be D/C'd Home per MD order via volunteer services wheelchair with hard copies of all prescriptions and coupon for medications provided by case management Debbie.    All IV's will be discontinued and monitored for bleeding.  All belongings will be returned to patient for patient to take home.  Last Documented Vital Signs:  Blood pressure 99/62, pulse 70, temperature 97.7 F (36.5 C), temperature source Oral, resp. rate 18, height 5\' 7"  (1.702 m), weight 52.5 kg (115 lb 11.9 oz), SpO2 100 %.  Leane Platt RN, BS, BSN

## 2015-03-27 LAB — GC/CHLAMYDIA PROBE AMP (~~LOC~~) NOT AT ARMC
CHLAMYDIA, DNA PROBE: NEGATIVE
NEISSERIA GONORRHEA: NEGATIVE

## 2015-03-27 LAB — HIV ANTIBODY (ROUTINE TESTING W REFLEX): HIV SCREEN 4TH GENERATION: NONREACTIVE

## 2015-03-30 ENCOUNTER — Inpatient Hospital Stay: Payer: Self-pay | Admitting: Family Medicine

## 2015-04-01 ENCOUNTER — Ambulatory Visit: Payer: Self-pay | Admitting: Family Medicine

## 2015-04-01 ENCOUNTER — Telehealth: Payer: Self-pay | Admitting: Family Medicine

## 2015-04-01 DIAGNOSIS — Z0289 Encounter for other administrative examinations: Secondary | ICD-10-CM

## 2015-04-01 NOTE — Telephone Encounter (Signed)
Patient did not come in for their appointment today [facility] d/c follow up.  Please let me know if patient needs to be contacted immediately for follow up or no follow up needed.

## 2015-04-15 ENCOUNTER — Other Ambulatory Visit: Payer: Self-pay | Admitting: Family Medicine

## 2015-04-15 NOTE — Telephone Encounter (Signed)
Rx also faxed to [facility] [address] at [phone] per Charline BillsAdrienne Sauls.

## 2015-04-15 NOTE — Telephone Encounter (Signed)
Ok to refill 

## 2015-04-15 NOTE — Telephone Encounter (Signed)
Last filled 03/18/2015--please advise

## 2015-04-15 NOTE — Telephone Encounter (Signed)
Rx called in to pharmacy. 

## 2015-04-16 ENCOUNTER — Other Ambulatory Visit: Payer: Self-pay | Admitting: Family Medicine

## 2015-04-17 ENCOUNTER — Other Ambulatory Visit: Payer: Self-pay | Admitting: Family Medicine

## 2015-04-18 ENCOUNTER — Other Ambulatory Visit: Payer: Self-pay | Admitting: Family Medicine

## 2015-04-21 ENCOUNTER — Other Ambulatory Visit: Payer: Self-pay | Admitting: Family Medicine

## 2015-04-22 ENCOUNTER — Other Ambulatory Visit: Payer: Self-pay | Admitting: Family Medicine

## 2015-04-22 NOTE — Telephone Encounter (Signed)
Pt left v/m requesting refill hydroxyzine to [facility]; pt having more anxiety. Pt request cb. Last seen for anxiety on 07/07/2014 and hydroxyzine was d/c due to pts ins not covering med;med is not on current med list.Please advise.

## 2015-04-22 NOTE — Telephone Encounter (Signed)
Spoke to pt and informed him he is required to have an OV before restarting medication. He has not been on it in over 6mos and is no longer on his med list. Pt has acute appt tomorrow and will discuss at that time.

## 2015-04-23 ENCOUNTER — Encounter: Payer: Self-pay | Admitting: Family Medicine

## 2015-04-23 ENCOUNTER — Ambulatory Visit (INDEPENDENT_AMBULATORY_CARE_PROVIDER_SITE_OTHER): Payer: Self-pay | Admitting: Family Medicine

## 2015-04-23 VITALS — BP 122/88 | HR 98 | Temp 98.2°F | Wt 118.5 lb

## 2015-04-23 DIAGNOSIS — H6122 Impacted cerumen, left ear: Secondary | ICD-10-CM

## 2015-04-23 MED ORDER — HYDROXYZINE HCL 10 MG PO TABS: 10.0000 mg | ORAL_TABLET | Freq: Three times a day (TID) | ORAL | Status: DC | PRN

## 2015-04-23 NOTE — Progress Notes (Signed)
Pre visit review using our clinic review tool, if applicable. No additional management support is needed unless otherwise documented below in the visit note. 

## 2015-04-23 NOTE — Assessment & Plan Note (Signed)
Ceruminosis is noted.  Wax is removed by syringing and manual debridement. Instructions for home care to prevent wax buildup are given.  

## 2015-04-23 NOTE — Progress Notes (Signed)
Subjective:   Patient ID: Todd Hanna, male    DOB: 04/15/1986, 29 y.o.   MRN: [id number]  Todd Hanna is a pleasant 29 y.o. year old male who presents to clinic today with Ear Pain and Anxiety  on 04/23/2015  HPI:  Left ear pain- over two weeks, left ear progressively more painful.  Now can't hear anything out of his left ear. It has been draining some fluid but not much.  Has not tried anything for it. Has not been swimming lately.  Current Outpatient Prescriptions on File Prior to Visit  Medication Sig Dispense Refill  . clonazePAM (KLONOPIN) 0.5 MG tablet TAKE 1 TABLET TWICE A DAY 60 tablet 0  . cloNIDine (CATAPRES) 0.1 MG tablet TAKE 1 TABLET BY MOUTH EVERY DAY 30 tablet 10  . doxycycline (VIBRA-TABS) 100 MG tablet Take 1 tablet (100 mg total) by mouth every 12 (twelve) hours. 14 tablet 0  . feeding supplement, ENSURE ENLIVE, (ENSURE ENLIVE) LIQD Take 237 mLs by mouth 2 (two) times daily between meals. 237 mL 12  . ibuprofen (ADVIL,MOTRIN) 800 MG tablet Take 1 tablet (800 mg total) by mouth 3 (three) times daily. 21 tablet 0  . nicotine (NICODERM CQ - DOSED IN MG/24 HOURS) 14 mg/24hr patch Place 1 patch (14 mg total) onto the skin once. 28 patch 0  . oxyCODONE (OXY IR/ROXICODONE) 5 MG immediate release tablet Take 1 tablet (5 mg total) by mouth every 4 (four) hours as needed for moderate pain. 20 tablet 0  . saccharomyces boulardii (FLORASTOR) 250 MG capsule Take 1 capsule (250 mg total) by mouth 2 (two) times daily. 60 capsule 0   No current facility-administered medications on file prior to visit.    Allergies  Allergen Reactions  . Amoxicillin Hives  . Penicillins Hives    Past Medical History  Diagnosis Date  . Hypertension   . Anxiety   . Ulcerative colitis 2011    colonoscopy at [facility]  . Nephrolithiasis   . Pneumonia ~ 2011  . History of stomach ulcers   . Myocardial infarction 2005    "overdosed on caffeine"  . Epididymitis, right 03/25/2015    Past  Surgical History  Procedure Laterality Date  . Lithotripsy  2011  . Colonoscopy  2011    dx'd ulcerative colitis    Family History  Problem Relation Age of Onset  . Hypertension Mother     History   Social History  . Marital Status: Single    Spouse Name: N/A  . Number of Children: 1  . Years of Education: N/A   Occupational History  . 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 Smoking status: Current Every Day Smoker -- 0.60 packs/day for 13 years    Types: Cigarettes  . Smokeless tobacco: Never Used  . Alcohol Use: 2.4 oz/week    4 Cans of beer per week  . Drug Use: No  . Sexual Activity: Yes   Other Topics Concern  . Not on file   Social History Narrative   The PMH, PSH, Social History, Family History, Medications, and allergies have been reviewed in [facility], and have been updated if relevant.    Review of Systems  Constitutional: Negative.   HENT: Positive for ear discharge, ear pain and hearing loss. Negative for congestion.   Respiratory: Negative.   Cardiovascular: Negative.  Negative for chest pain.  Gastrointestinal: Negative.   All other systems reviewed and are negative.  Objective:    BP 122/88 mmHg  Pulse 98  Temp(Src) 98.2 F (36.8 C) (Oral)  Wt 118 lb 8 oz (53.751 kg)  SpO2 98%   Physical Exam  Constitutional: He appears well-developed and well-nourished.  HENT:  Head: Normocephalic.  Left ear- cerumen impaction  Eyes: Conjunctivae are normal.  Neck: Normal range of motion.  Cardiovascular: Normal rate.   Pulmonary/Chest: Effort normal.  Skin: Skin is warm and dry.  Psychiatric: He has a normal mood and affect. His behavior is normal. Judgment and thought content normal.  Nursing note and vitals reviewed.         Assessment & Plan:   Left ear impacted cerumen No Follow-up on file.

## 2015-04-23 NOTE — Addendum Note (Signed)
Addended by: Dianne Dun on: 04/23/2015 12:00 PM   Modules accepted: Orders

## 2015-05-13 ENCOUNTER — Other Ambulatory Visit: Payer: Self-pay | Admitting: Family Medicine

## 2015-05-13 NOTE — Telephone Encounter (Signed)
Dr. [name] last note said that he could not afford Hydroxyzine. He should not be taking this an Clonazepam

## 2015-05-13 NOTE — Telephone Encounter (Signed)
Ok to phone in Clonazepam. He should make a f/u appt by 07/2015

## 2015-05-13 NOTE — Telephone Encounter (Signed)
Pt is also taking klonopin. pls advise

## 2015-05-13 NOTE — Telephone Encounter (Signed)
Pt has not had any recent f/u appts 

## 2015-05-14 NOTE — Addendum Note (Signed)
Addended by: Desmond Dike on: 05/14/2015 08:29 AM   Modules accepted: Orders, Medications

## 2015-05-14 NOTE — Telephone Encounter (Signed)
rx called in to requested pharmacy 

## 2015-06-11 ENCOUNTER — Other Ambulatory Visit: Payer: Self-pay | Admitting: Family Medicine

## 2015-06-11 NOTE — Telephone Encounter (Signed)
Rx called in as prescribed an paper Rx destroyed

## 2015-06-11 NOTE — Telephone Encounter (Signed)
Electronic refill request, no recent CPE or f/u, last refilled on 05/13/15 #60 with 0 refill, please advise

## 2015-06-26 ENCOUNTER — Emergency Department (HOSPITAL_COMMUNITY)
Admission: EM | Admit: 2015-06-26 | Discharge: 2015-06-27 | Disposition: A | Discharge status: Home / Self Care | Payer: Self-pay | Attending: Emergency Medicine | Admitting: Emergency Medicine

## 2015-06-26 ENCOUNTER — Encounter (HOSPITAL_COMMUNITY): Payer: Self-pay | Admitting: Emergency Medicine

## 2015-06-26 DIAGNOSIS — T401X1A Poisoning by heroin, accidental (unintentional), initial encounter: Secondary | ICD-10-CM | POA: Insufficient documentation

## 2015-06-26 DIAGNOSIS — Z720 Tobacco use: Secondary | ICD-10-CM | POA: Insufficient documentation

## 2015-06-26 DIAGNOSIS — Z8701 Personal history of pneumonia (recurrent): Secondary | ICD-10-CM | POA: Insufficient documentation

## 2015-06-26 DIAGNOSIS — F419 Anxiety disorder, unspecified: Secondary | ICD-10-CM | POA: Insufficient documentation

## 2015-06-26 DIAGNOSIS — I252 Old myocardial infarction: Secondary | ICD-10-CM | POA: Insufficient documentation

## 2015-06-26 DIAGNOSIS — Z87438 Personal history of other diseases of male genital organs: Secondary | ICD-10-CM | POA: Insufficient documentation

## 2015-06-26 DIAGNOSIS — Z79899 Other long term (current) drug therapy: Secondary | ICD-10-CM | POA: Insufficient documentation

## 2015-06-26 DIAGNOSIS — Z8719 Personal history of other diseases of the digestive system: Secondary | ICD-10-CM | POA: Insufficient documentation

## 2015-06-26 DIAGNOSIS — Y9389 Activity, other specified: Secondary | ICD-10-CM | POA: Insufficient documentation

## 2015-06-26 DIAGNOSIS — Z880 Allergy status to penicillin: Secondary | ICD-10-CM | POA: Insufficient documentation

## 2015-06-26 DIAGNOSIS — Y9289 Other specified places as the place of occurrence of the external cause: Secondary | ICD-10-CM | POA: Insufficient documentation

## 2015-06-26 DIAGNOSIS — Z791 Long term (current) use of non-steroidal anti-inflammatories (NSAID): Secondary | ICD-10-CM | POA: Insufficient documentation

## 2015-06-26 DIAGNOSIS — I10 Essential (primary) hypertension: Secondary | ICD-10-CM | POA: Insufficient documentation

## 2015-06-26 DIAGNOSIS — Z87448 Personal history of other diseases of urinary system: Secondary | ICD-10-CM | POA: Insufficient documentation

## 2015-06-26 DIAGNOSIS — Y998 Other external cause status: Secondary | ICD-10-CM | POA: Insufficient documentation

## 2015-06-26 LAB — CBC
HCT: 43.3 % (ref 39.0–52.0)
Hemoglobin: 15.3 g/dL (ref 13.0–17.0)
MCH: 32.3 pg (ref 26.0–34.0)
MCHC: 35.3 g/dL (ref 30.0–36.0)
MCV: 91.5 fL (ref 78.0–100.0)
Platelets: 269 10*3/uL (ref 150–400)
RBC: 4.73 MIL/uL (ref 4.22–5.81)
RDW: 12.9 % (ref 11.5–15.5)
WBC: 19.1 10*3/uL — ABNORMAL HIGH (ref 4.0–10.5)

## 2015-06-26 LAB — COMPREHENSIVE METABOLIC PANEL
ALK PHOS: 64 U/L (ref 38–126)
ALT: 44 U/L (ref 17–63)
ANION GAP: 14 (ref 5–15)
AST: 85 U/L — ABNORMAL HIGH (ref 15–41)
Albumin: 4.1 g/dL (ref 3.5–5.0)
BILIRUBIN TOTAL: 0.4 mg/dL (ref 0.3–1.2)
BUN: 10 mg/dL (ref 6–20)
CALCIUM: 8.5 mg/dL — AB (ref 8.9–10.3)
CO2: 20 mmol/L — AB (ref 22–32)
Chloride: 102 mmol/L (ref 101–111)
Creatinine, Ser: 1.03 mg/dL (ref 0.61–1.24)
GFR calc non Af Amer: >60 mL/min (ref >60)
Glucose, Bld: 239 mg/dL — ABNORMAL HIGH (ref 65–99)
POTASSIUM: 3.6 mmol/L (ref 3.5–5.1)
SODIUM: 136 mmol/L (ref 135–145)
TOTAL PROTEIN: 7.3 g/dL (ref 6.5–8.1)

## 2015-06-26 LAB — ACETAMINOPHEN LEVEL

## 2015-06-26 LAB — CBG MONITORING, ED: GLUCOSE-CAPILLARY: 234 mg/dL — AB (ref 65–99)

## 2015-06-26 LAB — ETHANOL: Alcohol, Ethyl (B): 29 mg/dL — ABNORMAL HIGH (ref <5)

## 2015-06-26 LAB — SALICYLATE LEVEL: Salicylate Lvl: <4 mg/dL (ref 2.8–30.0)

## 2015-06-26 NOTE — ED Notes (Signed)
Bed: RESB Expected date:  Expected time:  Means of arrival:  Comments: EMS -Heroin OD 

## 2015-06-26 NOTE — ED Notes (Addendum)
Pt BIB [facility] c/o heroin overdose. Per [facility] patient was found face down in bathroom by parents. Pt has dried blood to right nare. Upon arrival of FIRE patient respirations were about 4/min. Pt was bagged and given 2 mg of Narcan Intranasally. 16 G RT AC. Hx of MI. 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 CBG 361 with [facility]. He reports to [facility] that he used .1-.2mL of heroin around 2200. Pt alert and oriented upon arrival.  He denies any other drug use or alcohol use.

## 2015-06-26 NOTE — ED Provider Notes (Signed)
CSN: [id number]     Arrival date & time 06/26/15  2241 History   First MD Initiated Contact with Patient 06/26/15 2255     Chief Complaint  Patient presents with  . Drug Overdose      HPI Patient presents after a unintentional heroin overdose.  He was found unresponsive by his parents.  EMS was called.  On fires arrival the patient is breathing naproxen 4 times a minute.  He was given 2 mg of Narcan with return of spontaneous breathing.  He denies daily use of heroin.  He has no complaints at this time.  He feels full list and is upset that this occurred.  He states he does not use heroin not often.  He states he took a very small amount.   Past Medical History  Diagnosis Date  . Hypertension   . Anxiety   . Ulcerative colitis 2011    colonoscopy at [facility]  . Nephrolithiasis   . Pneumonia ~ 2011  . History of stomach ulcers   . Epididymitis, right 03/25/2015  . Myocardial infarction    Past Surgical History  Procedure Laterality Date  . Lithotripsy  2011  . Colonoscopy  2011    dx'd ulcerative colitis   Family History  Problem Relation Age of Onset  . Hypertension Mother    Social History  Substance Use Topics  . Smoking status: Current Every Day Smoker -- 0.60 packs/day for 13 years    Types: Cigarettes  . Smokeless tobacco: Never Used  . Alcohol Use: 2.4 oz/week    4 Cans of beer per week    Review of Systems  All other systems reviewed and are negative.     Allergies  Amoxicillin and Penicillins  Home Medications   Prior to Admission medications   Medication Sig Start Date End Date Taking? Authorizing Provider  clonazePAM (KLONOPIN) 0.5 MG tablet TAKE 1 TABLET TWICE A DAY 06/11/15   Dianne Dun, MD  cloNIDine (CATAPRES) 0.1 MG tablet TAKE 1 TABLET BY MOUTH EVERY DAY 07/07/14   Dianne Dun, MD  feeding supplement, ENSURE ENLIVE, (ENSURE ENLIVE) LIQD Take 237 mLs by mouth 2 (two) times daily between meals. 03/26/15   Maryann Mikhail, DO  ibuprofen  (ADVIL,MOTRIN) 800 MG tablet Take 1 tablet (800 mg total) by mouth 3 (three) times daily. 09/28/14   Glynn Octave, MD  nicotine (NICODERM CQ - DOSED IN MG/24 HOURS) 14 mg/24hr patch Place 1 patch (14 mg total) onto the skin once. 03/26/15   Maryann Mikhail, DO  saccharomyces boulardii (FLORASTOR) 250 MG capsule Take 1 capsule (250 mg total) by mouth 2 (two) times daily. 03/26/15   Maryann Mikhail, DO   BP 115/87 mmHg  Pulse 107  Resp 12  SpO2 94% Physical Exam  Constitutional: He is oriented to person, place, and time. He appears well-developed and well-nourished.  HENT:  Head: Normocephalic and atraumatic.  Eyes: EOM are normal.  Neck: Normal range of motion.  Cardiovascular: Normal rate, regular rhythm, normal heart sounds and intact distal pulses.   Pulmonary/Chest: Effort normal and breath sounds normal. No respiratory distress.  Abdominal: Soft. He exhibits no distension. There is no tenderness.  Musculoskeletal: Normal range of motion.  Neurological: He is alert and oriented to person, place, and time.  Skin: Skin is warm and dry.  Psychiatric: He has a normal mood and affect. Judgment normal.  Nursing note and vitals reviewed.   ED Course  Procedures (including critical care time) Labs Review Labs  Reviewed  COMPREHENSIVE METABOLIC PANEL - Abnormal; Notable for the following:    CO2 20 (*)    Glucose, Bld 239 (*)    Calcium 8.5 (*)    AST 85 (*)    All other components within normal limits  ETHANOL - Abnormal; Notable for the following:    Alcohol, Ethyl (B) 29 (*)    All other components within normal limits  ACETAMINOPHEN LEVEL - Abnormal; Notable for the following:    Acetaminophen (Tylenol), Serum <10 (*)    All other components within normal limits  CBC - Abnormal; Notable for the following:    WBC 19.1 (*)    All other components within normal limits  CBG MONITORING, ED - Abnormal; Notable for the following:    Glucose-Capillary 234 (*)    All other  components within normal limits  SALICYLATE LEVEL  URINE RAPID DRUG SCREEN, HOSP PERFORMED    Imaging Review No results found. I have personally reviewed and evaluated these images and lab results as part of my medical decision-making.   EKG Interpretation None      MDM   Final diagnoses:  Heroin overdose, accidental or unintentional, initial encounter    Overall well-appearing.  Unintentional overdose.  Patient was observed in the emergency department for an hour and 15 minutes without recurrent somnolence.  No indication for additional Narcan.  Discharge home in good condition.    Azalia Bilis, MD 06/26/15 [phone]

## 2015-06-26 NOTE — Discharge Instructions (Signed)
Narcotic Overdose A narcotic overdose is the misuse or overuse of a narcotic drug. A narcotic overdose can make you pass out and stop breathing. If you are not treated right away, this can cause permanent brain damage or stop your heart. Medicine may be given to reverse the effects of an overdose. If so, this medicine may bring on withdrawal symptoms. The symptoms may be abdominal cramps, throwing up (vomiting), sweating, chills, and nervousness. Injecting narcotics can cause more problems than just an overdose. AIDS, hepatitis, and other very serious infections are transmitted by sharing needles and syringes. If you decide to quit using, there are medicines which can help you through the withdrawal period. Trying to quit all at once on your own can be uncomfortable, but not life-threatening. Call your caregiver, Narcotics Anonymous, or any drug and alcohol treatment program for further help.  Document Released: 10/27/2004 Document Revised: 12/12/2011 Document Reviewed: 08/21/2009 [facility] Patient Information 2015 [place], [place]. This information is not intended to replace advice given to you by your health care provider. Make sure you discuss any questions you have with your health care provider.

## 2015-07-10 ENCOUNTER — Other Ambulatory Visit: Payer: Self-pay | Admitting: Family Medicine

## 2015-07-10 NOTE — Telephone Encounter (Signed)
Last f/u 07/2014 

## 2015-07-11 ENCOUNTER — Other Ambulatory Visit: Payer: Self-pay | Admitting: Family Medicine

## 2015-07-13 NOTE — Telephone Encounter (Signed)
Rx called in to requested pharmacy 

## 2015-08-08 ENCOUNTER — Other Ambulatory Visit: Payer: Self-pay | Admitting: Family Medicine

## 2015-08-10 NOTE — Telephone Encounter (Signed)
Pt has not had f/u since 07/2014 

## 2015-08-11 ENCOUNTER — Other Ambulatory Visit: Payer: Self-pay | Admitting: Family Medicine

## 2015-08-11 NOTE — Telephone Encounter (Signed)
Rx called in to requested pharmacy 

## 2015-08-11 NOTE — Telephone Encounter (Signed)
Lm on pts informing him OV required for additional refill. Was also listed on last Rx OV required

## 2015-08-11 NOTE — Telephone Encounter (Signed)
Pt left v/m; pt scheduled appt with Dr Dayton MartesAron on 08/07/15 at 10:45 AM. Pt request cb when refilled. rx last refilled # 60 on 07/10/15.Please advise.

## 2015-08-17 ENCOUNTER — Telehealth: Payer: Self-pay | Admitting: Family Medicine

## 2015-08-17 ENCOUNTER — Ambulatory Visit: Payer: Self-pay | Admitting: Family Medicine

## 2015-08-17 DIAGNOSIS — Z0289 Encounter for other administrative examinations: Secondary | ICD-10-CM

## 2015-08-17 NOTE — Telephone Encounter (Signed)
Pt did not come in for their appt today for follow up. Please let me know if pt needs to be contacted immediately for follow up or no follow up needed. Best phone number to contact pt is [phone].

## 2015-09-06 ENCOUNTER — Other Ambulatory Visit: Payer: Self-pay | Admitting: Family Medicine

## 2015-09-07 NOTE — Telephone Encounter (Signed)
Pt NSH last f/u appt after receiving medication. pls advise

## 2015-09-23 ENCOUNTER — Other Ambulatory Visit: Payer: Self-pay | Admitting: Family Medicine

## 2015-09-23 NOTE — Telephone Encounter (Signed)
Pt has not been seen for f/u since 07/2014

## 2015-09-23 NOTE — Telephone Encounter (Signed)
Needs appt ASAP and if he no shows or cancels, no further refills.  Ok to refill enough until follow up appt only.

## 2015-09-24 NOTE — Telephone Encounter (Signed)
Lm on pts vm requesting a call back to schedule OV 

## 2015-09-29 ENCOUNTER — Ambulatory Visit: Payer: Self-pay | Admitting: Family Medicine

## 2015-09-30 ENCOUNTER — Encounter: Payer: Self-pay | Admitting: Family Medicine

## 2015-09-30 ENCOUNTER — Ambulatory Visit (INDEPENDENT_AMBULATORY_CARE_PROVIDER_SITE_OTHER): Payer: Self-pay | Admitting: Family Medicine

## 2015-09-30 ENCOUNTER — Encounter: Payer: Self-pay | Admitting: *Deleted

## 2015-09-30 VITALS — BP 148/92 | HR 110 | Temp 97.8°F | Wt 120.8 lb

## 2015-09-30 DIAGNOSIS — Z23 Encounter for immunization: Secondary | ICD-10-CM

## 2015-09-30 DIAGNOSIS — I10 Essential (primary) hypertension: Secondary | ICD-10-CM

## 2015-09-30 DIAGNOSIS — F4310 Post-traumatic stress disorder, unspecified: Secondary | ICD-10-CM

## 2015-09-30 DIAGNOSIS — F419 Anxiety disorder, unspecified: Secondary | ICD-10-CM

## 2015-09-30 MED ORDER — HYDROXYZINE HCL 10 MG PO TABS: 10.0000 mg | ORAL_TABLET | Freq: Three times a day (TID) | ORAL | Status: DC | PRN

## 2015-09-30 MED ORDER — CLONAZEPAM 0.5 MG PO TABS: 0.5000 mg | ORAL_TABLET | Freq: Two times a day (BID) | ORAL | Status: DC

## 2015-09-30 NOTE — Progress Notes (Signed)
Subjective:   Patient ID: Todd Hanna, male    DOB: 02/17/1986, 29 y.o.   MRN: [id number]  Todd Hanna is a pleasant 29 y.o. year old male who presents to clinic today with Follow-up and Medication Refill  on 09/30/2015  HPI: Anxiety- deteriorated since he ran out of klonipin, currently taking Klonipin 0.5 mg twice daily.  Can no longer take afford hydroxizine- does not currently have insurance.  Has also tried zoloft, paxil, celexa, lexapro, buspar and remeron.  Anxious but denies being depressed. Appetite ok.  Weight stable.  He has been working.   Wt Readings from Last 3 Encounters:  09/30/15 120 lb 12 oz (54.772 kg)  04/23/15 118 lb 8 oz (53.751 kg)  03/25/15 115 lb 11.9 oz (52.5 kg)   He and his daughter are in a better place too.  No longer attending therapy together.  HTN- has been taking catapress 0.1 mg twice daily.  Current Outpatient Prescriptions on File Prior to Visit  Medication Sig Dispense Refill  . cloNIDine (CATAPRES) 0.1 MG tablet TAKE 1 TABLET BY MOUTH EVERY DAY 30 tablet 0   No current facility-administered medications on file prior to visit.    Allergies  Allergen Reactions  . Amoxicillin Hives  . Penicillins Hives    Past Medical History  Diagnosis Date  . Hypertension   . Anxiety   . Ulcerative colitis 2011    colonoscopy at [facility]  . Nephrolithiasis   . Pneumonia ~ 2011  . History of stomach ulcers   . Epididymitis, right 03/25/2015  . Myocardial infarction [facility])     Past Surgical History  Procedure Laterality Date  . Lithotripsy  2011  . Colonoscopy  2011    dx'd ulcerative colitis    Family History  Problem Relation Age of Onset  . Hypertension Mother     Social History   Social History  . Marital Status: Single    Spouse Name: N/A  . Number of Children: 1  . Years of Education: N/A   Occupational History  . 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 Smoking status: Current Every Day Smoker -- 0.60  packs/day for 13 years    Types: Cigarettes  . Smokeless tobacco: Never Used  . Alcohol Use: 2.4 oz/week    4 Cans of beer per week  . Drug Use: No  . Sexual Activity: Yes   Other Topics Concern  . Not on file   Social History Narrative   The PMH, PSH, Social History, Family History, Medications, and allergies have been reviewed in [facility], and have been updated if relevant.   Review of Systems  Psychiatric/Behavioral: Positive for sleep disturbance. Negative for suicidal ideas, hallucinations, behavioral problems, confusion, self-injury, dysphoric mood, decreased concentration and agitation. The patient is nervous/anxious. The patient is not hyperactive.   All other systems reviewed and are negative.     Objective:    BP 148/92 mmHg  Pulse 110  Temp(Src) 97.8 F (36.6 C) (Oral)  Wt 120 lb 12 oz (54.772 kg)  SpO2 98%   Physical Exam  Constitutional: He is oriented to person, place, and time. He appears well-developed and well-nourished. No distress.  HENT:  Head: Normocephalic.  Eyes: Conjunctivae are normal.  Cardiovascular: Normal rate and regular rhythm.   Pulmonary/Chest: Effort normal and breath sounds normal. No respiratory distress.  Musculoskeletal: Normal range of motion. He exhibits no edema.  Neurological: He is alert and oriented to person, place, and time. No  cranial nerve deficit.  Skin: Skin is warm and dry.  Psychiatric: His speech is normal. His mood appears anxious.  Nursing note and vitals reviewed.         Assessment & Plan:   Need for influenza vaccination - Plan: Flu Vaccine QUAD 36+ mos PF IM (Fluarix & Fluzone Quad PF)  Anxiety  Essential hypertension  PTSD (post-traumatic stress disorder) No Follow-up on file.

## 2015-09-30 NOTE — Assessment & Plan Note (Signed)
Deteriorated. Due for UDS today.  Needs to do this prior to any further refills.  Also discussed importance of restarting hydroxyzine and backing off of klonipin.  He thinks now that he is working, he could afford it.  eRx sent to his pharmacy.

## 2015-09-30 NOTE — Patient Instructions (Signed)
Great to see you. Let's restart hydroxyzine and keep me updated.  Happy birthday!

## 2015-09-30 NOTE — Assessment & Plan Note (Signed)
Deteriorated likely due to abrupt cessation of benzo. Asymptomatic.  Continue to monitor.

## 2015-10-02 ENCOUNTER — Other Ambulatory Visit: Payer: Self-pay | Admitting: Family Medicine

## 2015-10-20 ENCOUNTER — Encounter: Payer: Self-pay | Admitting: Family Medicine

## 2015-10-26 ENCOUNTER — Other Ambulatory Visit: Payer: Self-pay | Admitting: *Deleted

## 2015-10-26 MED ORDER — HYDROXYZINE HCL 10 MG PO TABS: 10.0000 mg | ORAL_TABLET | Freq: Three times a day (TID) | ORAL | Status: DC | PRN

## 2015-10-28 ENCOUNTER — Other Ambulatory Visit: Payer: Self-pay | Admitting: *Deleted

## 2015-10-28 MED ORDER — CLONAZEPAM 0.5 MG PO TABS: 0.5000 mg | ORAL_TABLET | Freq: Two times a day (BID) | ORAL | Status: DC

## 2015-10-28 NOTE — Telephone Encounter (Signed)
Last f/u 09/2015 

## 2015-10-29 NOTE — Telephone Encounter (Signed)
Rx called in to requested pharmacy 

## 2015-10-30 ENCOUNTER — Telehealth: Payer: Self-pay

## 2015-10-30 NOTE — Telephone Encounter (Signed)
Spoke to pharmacy who states that there is no way he can be taking them as listed below. She states last month was the first time he has filled hydroxyzine "in a while."  Pharmacy states that when sending over Rx for klonopin, it will need to be included in the instruction that he pickup up both and that he may also need to be brought into office and explained this

## 2015-10-30 NOTE — Telephone Encounter (Signed)
I would prefer he pick up both so he can used hydroxyzine first line and clonazepam if it is uneffective.

## 2015-10-30 NOTE — Telephone Encounter (Signed)
[facility] left v/m; pt only wants to pick up clonazepam and not pick up hydroxyzine. [facility] request cb that it is OK for pt to pick up clonazepam only.

## 2015-10-30 NOTE — Telephone Encounter (Signed)
Noted.  Thank you.  I just saw him for follow up.

## 2015-11-27 ENCOUNTER — Other Ambulatory Visit: Payer: Self-pay | Admitting: Family Medicine

## 2015-11-27 NOTE — Telephone Encounter (Signed)
Rx called in to requested pharmacy 

## 2015-11-27 NOTE — Telephone Encounter (Signed)
Pt left v/m requesting cb about update on med refills.

## 2015-11-27 NOTE — Telephone Encounter (Signed)
Last f/u 12/016

## 2015-12-24 ENCOUNTER — Other Ambulatory Visit: Payer: Self-pay | Admitting: Family Medicine

## 2015-12-24 NOTE — Telephone Encounter (Signed)
Last f/u 09/2015 

## 2015-12-24 NOTE — Telephone Encounter (Signed)
Rx called in to requested pharmacy 

## 2016-01-21 ENCOUNTER — Other Ambulatory Visit: Payer: Self-pay | Admitting: Family Medicine

## 2016-01-21 NOTE — Telephone Encounter (Signed)
Last f/u 09/2015 

## 2016-01-22 ENCOUNTER — Other Ambulatory Visit: Payer: Self-pay | Admitting: Family Medicine

## 2016-01-22 NOTE — Telephone Encounter (Signed)
Rx called in to requested pharamacy 

## 2016-02-19 ENCOUNTER — Other Ambulatory Visit: Payer: Self-pay | Admitting: Family Medicine

## 2016-02-22 ENCOUNTER — Other Ambulatory Visit: Payer: Self-pay | Admitting: Family Medicine

## 2016-02-22 NOTE — Telephone Encounter (Signed)
Pt wanted to ck on status of clonazepam refill; spoke with Randall Hiss at [facility] and rx will be ready for pick up in 1 hr. Pt voiced understanding.

## 2016-02-22 NOTE — Telephone Encounter (Signed)
Last f/u 09/2015 

## 2016-02-22 NOTE — Telephone Encounter (Signed)
Rx called in to requested pharmacy 

## 2016-03-22 ENCOUNTER — Other Ambulatory Visit: Payer: Self-pay | Admitting: Family Medicine

## 2016-03-22 NOTE — Telephone Encounter (Signed)
rx called in to requested pharmacy 

## 2016-03-22 NOTE — Telephone Encounter (Signed)
Last f/u 09/2015 

## 2016-04-20 ENCOUNTER — Other Ambulatory Visit: Payer: Self-pay | Admitting: Family Medicine

## 2016-04-20 NOTE — Telephone Encounter (Signed)
Called in to [facility] 

## 2016-04-20 NOTE — Telephone Encounter (Signed)
Last f/u 09/2015 

## 2016-05-03 ENCOUNTER — Other Ambulatory Visit: Payer: Self-pay | Admitting: Family Medicine

## 2016-05-19 ENCOUNTER — Other Ambulatory Visit: Payer: Self-pay | Admitting: Family Medicine

## 2016-05-19 NOTE — Telephone Encounter (Signed)
Last f/u 09/2015 

## 2016-05-20 NOTE — Telephone Encounter (Signed)
Rx called in to [facility] [address].

## 2016-06-16 ENCOUNTER — Other Ambulatory Visit: Payer: Self-pay | Admitting: Family Medicine

## 2016-06-20 ENCOUNTER — Other Ambulatory Visit: Payer: Self-pay | Admitting: Family Medicine

## 2016-06-20 NOTE — Telephone Encounter (Signed)
Pt left v/m checking on status of clonazepam refill;spoke with Melanie at [facility] and pt is there now to pick up. Melanie advised pt I was on phone and he said nothing further needed.

## 2016-06-20 NOTE — Telephone Encounter (Signed)
Rx called in to requested pharmacy 

## 2016-06-20 NOTE — Telephone Encounter (Signed)
Last f/u 09/2016 

## 2016-07-17 ENCOUNTER — Other Ambulatory Visit: Payer: Self-pay | Admitting: Family Medicine

## 2016-07-19 ENCOUNTER — Other Ambulatory Visit: Payer: Self-pay | Admitting: Family Medicine

## 2016-07-19 NOTE — Telephone Encounter (Signed)
Last f/u 09/2015 

## 2016-07-19 NOTE — Telephone Encounter (Signed)
Rx called in to requested pharmacy 

## 2016-08-16 ENCOUNTER — Other Ambulatory Visit: Payer: Self-pay | Admitting: Family Medicine

## 2016-08-16 MED ORDER — CLONAZEPAM 0.5 MG PO TABS: 0.5000 mg | ORAL_TABLET | Freq: Two times a day (BID) | ORAL | 0 refills | Status: DC

## 2016-08-16 NOTE — Telephone Encounter (Signed)
Rx called in to requested pharmacy 

## 2016-08-16 NOTE — Telephone Encounter (Signed)
Last f/u 09/2015 

## 2016-09-15 ENCOUNTER — Encounter (HOSPITAL_COMMUNITY): Payer: Self-pay | Admitting: Emergency Medicine

## 2016-09-15 ENCOUNTER — Emergency Department (HOSPITAL_COMMUNITY): Payer: Self-pay

## 2016-09-15 ENCOUNTER — Emergency Department (HOSPITAL_COMMUNITY)
Admission: EM | Admit: 2016-09-15 | Discharge: 2016-09-15 | Disposition: A | Discharge status: Home / Self Care | Payer: Self-pay | Attending: Emergency Medicine | Admitting: Emergency Medicine

## 2016-09-15 DIAGNOSIS — I10 Essential (primary) hypertension: Secondary | ICD-10-CM | POA: Insufficient documentation

## 2016-09-15 DIAGNOSIS — M79672 Pain in left foot: Secondary | ICD-10-CM | POA: Insufficient documentation

## 2016-09-15 DIAGNOSIS — I252 Old myocardial infarction: Secondary | ICD-10-CM | POA: Insufficient documentation

## 2016-09-15 DIAGNOSIS — S60042A Contusion of left ring finger without damage to nail, initial encounter: Secondary | ICD-10-CM | POA: Insufficient documentation

## 2016-09-15 DIAGNOSIS — F17210 Nicotine dependence, cigarettes, uncomplicated: Secondary | ICD-10-CM | POA: Insufficient documentation

## 2016-09-15 DIAGNOSIS — Y9339 Activity, other involving climbing, rappelling and jumping off: Secondary | ICD-10-CM | POA: Insufficient documentation

## 2016-09-15 DIAGNOSIS — Y929 Unspecified place or not applicable: Secondary | ICD-10-CM | POA: Insufficient documentation

## 2016-09-15 DIAGNOSIS — M79642 Pain in left hand: Secondary | ICD-10-CM

## 2016-09-15 DIAGNOSIS — Y999 Unspecified external cause status: Secondary | ICD-10-CM | POA: Insufficient documentation

## 2016-09-15 DIAGNOSIS — W1789XA Other fall from one level to another, initial encounter: Secondary | ICD-10-CM | POA: Insufficient documentation

## 2016-09-15 MED ORDER — IBUPROFEN 400 MG PO TABS
600.0000 mg | ORAL_TABLET | Freq: Once | ORAL | Status: AC
  Administered 2016-09-15: 600 mg via ORAL
  Filled 2016-09-15: qty 1

## 2016-09-15 MED ORDER — IBUPROFEN 600 MG PO TABS: 600.0000 mg | ORAL_TABLET | Freq: Four times a day (QID) | ORAL | 0 refills | Status: DC | PRN

## 2016-09-15 NOTE — ED Notes (Signed)
Declined W/C at D/C and was escorted to lobby by RN. 

## 2016-09-15 NOTE — ED Notes (Signed)
To x-ray

## 2016-09-15 NOTE — ED Provider Notes (Signed)
[facility] DEPT Provider Note   CSN: [id number] Arrival date & time: 09/15/16  2800   By signing my name below, I, Sonum Patel, attest that this documentation has been prepared under the direction and in the presence of Harlene Ramus, PA-C Electronically Signed: Sonum Patel, Education administrator. 09/15/16. 10:58 AM. History   Chief Complaint Chief Complaint  Patient presents with  . Ankle Pain  . Hand Pain    The history is provided by the patient. No language interpreter was used.    HPI Comments: KOLTER REAVER is a 30 y.o. male who presents to the Emergency Department complaining of left ankle injury that occurred 2 nights ago. Patient states he jumped off stage at a concert and fell, rolling his left ankle. He also reports left knuckle injury that occurred while breaking his fall. He states he has fractured his left ankle before which required casting but no surgery. He is able to ambulate but states the ankle pain worsens after standing/walking throughout the day. He states his knuckle pain is worse with movement and he is unable to make a complete fist. He denies LOC or head injury. He reports associated swelling to the left ankle which improved with taking ibuprofen. Denies numbness, tingling, weakness.   Past Medical History:  Diagnosis Date  . Anxiety   . Epididymitis, right 03/25/2015  . History of stomach ulcers   . Hypertension   . Myocardial infarction   . Nephrolithiasis   . Pneumonia ~ 2011  . Ulcerative colitis 2011   colonoscopy at [facility]    Patient Active Problem List   Diagnosis Date Noted  . Epididymitis without abscess 03/25/2015  . Tobacco abuse 03/25/2015  . PTSD (post-traumatic stress disorder) 07/07/2014  . Hypertension   . Anxiety   . Ulcerative colitis     Past Surgical History:  Procedure Laterality Date  . COLONOSCOPY  2011   dx'd ulcerative colitis  . LITHOTRIPSY  2011       Home Medications    Prior to Admission medications   Medication Sig  Start Date End Date Taking? Authorizing Provider  clonazePAM (KLONOPIN) 0.5 MG tablet Take 1 tablet (0.5 mg total) by mouth 2 (two) times daily. COMPLETE PHYSICAL EXAM REQUIRED FOR ANY ADDITIONAL REFILLS 08/16/16   Lucille Passy, MD  cloNIDine (CATAPRES) 0.1 MG tablet TAKE 1 TABLET BY MOUTH EVERY DAY 07/17/16   Lucille Passy, MD  hydrOXYzine (ATARAX/VISTARIL) 10 MG tablet Take 1 tablet (10 mg total) by mouth every 8 (eight) hours as needed. for anxiety 10/26/15   Lucille Passy, MD  ibuprofen (ADVIL,MOTRIN) 600 MG tablet Take 1 tablet (600 mg total) by mouth every 6 (six) hours as needed. 09/15/16   Nona Dell, PA-C    Family History Family History  Problem Relation Age of Onset  . Hypertension Mother     Social History Social History  Substance Use Topics  . Smoking status: Current Every Day Smoker    Packs/day: 0.60    Years: 13.00    Types: Cigarettes  . Smokeless tobacco: Never Used  . Alcohol use 2.4 oz/week    4 Cans of beer per week     Allergies   Amoxicillin and Penicillins   Review of Systems Review of Systems  Musculoskeletal: Positive for arthralgias, joint swelling and myalgias.  Neurological: Negative for syncope.    Physical Exam Updated Vital Signs BP 125/83 (BP Location: Left Arm)   Pulse 91   Temp 98.5 F (36.9 C) (Oral)  Resp 18   SpO2 98%   Physical Exam  Constitutional: He is oriented to person, place, and time. He appears well-developed and well-nourished.  HENT:  Head: Normocephalic and atraumatic.  Eyes: Conjunctivae and EOM are normal. Right eye exhibits no discharge. Left eye exhibits no discharge. No scleral icterus.  Neck: Normal range of motion. Neck supple.  Cardiovascular: Normal rate and intact distal pulses.   HR 90  Pulmonary/Chest: Effort normal.  Musculoskeletal: Normal range of motion. He exhibits tenderness. He exhibits no edema or deformity.  Tenderness to palpation over left forefoot and mild tenderness over  inferior ankle bilaterally. Mild swelling noted to forefoot. Full ROM of left toes, foot, ankle, and knee. Sensation grossly intact. Cap refill less than 2 sec. 2+ DP pulse.  Mild swelling and ecchymosis noted to left 4th PIP joint with decreased ROM of 4th digit due to reported pain and swelling. Patient unable to make a full fist due to pain in finger. Full ROM of left wrist, forearm, and elbow. Sensation grossly intact. Cap refill less than 2 sec. 2+ radial pulse.   Neurological: He is alert and oriented to person, place, and time.  Skin: Skin is warm and dry. Capillary refill takes less than 2 seconds.  Nursing note and vitals reviewed.    ED Treatments / Results  DIAGNOSTIC STUDIES: Oxygen Saturation is 100% on RA, normal by my interpretation.    COORDINATION OF CARE: 10:25 AM Discussed treatment plan with pt at bedside and pt agreed to plan.  Labs (all labs ordered are listed, but only abnormal results are displayed) Labs Reviewed - No data to display  EKG  EKG Interpretation None       Radiology Dg Ankle Complete Left  Result Date: 09/15/2016 CLINICAL DATA:  Jumped from stage II days ago with persistent ankle pain, initial encounter EXAM: LEFT ANKLE COMPLETE - 3+ VIEW COMPARISON:  None. FINDINGS: There is no evidence of fracture, dislocation, or joint effusion. There is no evidence of arthropathy or other focal bone abnormality. Soft tissues are unremarkable. IMPRESSION: No acute abnormality seen. Electronically Signed   By: Inez Catalina M.D.   On: 09/15/2016 11:25   Dg Hand Complete Left  Result Date: 09/15/2016 CLINICAL DATA:  Jumped off stage II days ago with left hand pain, initial encounter EXAM: LEFT HAND - COMPLETE 3+ VIEW COMPARISON:  None. FINDINGS: No acute fracture or dislocation is noted. Prior healed fracture of the distal fifth metacarpal is seen. No soft tissue abnormality is noted. IMPRESSION: No acute abnormality seen. Electronically Signed   By: Inez Catalina M.D.   On: 09/15/2016 11:24   Dg Foot Complete Left  Result Date: 09/15/2016 CLINICAL DATA:  Left foot swelling after fall. EXAM: LEFT FOOT - COMPLETE 3+ VIEW COMPARISON:  Radiographs of March 13, 2007. FINDINGS: There is no evidence of fracture or dislocation. There is no evidence of arthropathy or other focal bone abnormality. Soft tissues are unremarkable. IMPRESSION: Normal left foot. Electronically Signed   By: Marijo Conception, M.D.   On: 09/15/2016 11:25    Procedures Procedures (including critical care time)  Medications Ordered in ED Medications  ibuprofen (ADVIL,MOTRIN) tablet 600 mg (600 mg Oral Given 09/15/16 1046)     Initial Impression / Assessment and Plan / ED Course  I have reviewed the triage vital signs and the nursing notes.  Pertinent labs & imaging results that were available during my care of the patient were reviewed by me and considered in my  medical decision making (see chart for details).  Clinical Course     Patient X-Ray negative for obvious fracture or dislocation.  Pt advised to follow up with orthopedics as needed. Patient declined ACE wrap, conservative therapy recommended and discussed. Patient will be discharged home & is agreeable with above plan. Returns precautions discussed. Pt appears safe for discharge.   Final Clinical Impressions(s) / ED Diagnoses   Final diagnoses:  Left foot pain  Left hand pain    New Prescriptions New Prescriptions   IBUPROFEN (ADVIL,MOTRIN) 600 MG TABLET    Take 1 tablet (600 mg total) by mouth every 6 (six) hours as needed.   I personally performed the services described in this documentation, which was scribed in my presence. The recorded information has been reviewed and is accurate.     Chesley Noon [place], [place] 09/15/16 1148    Leo Grosser, MD 09/16/16 [phone]

## 2016-09-15 NOTE — ED Notes (Signed)
Rolled his left  ankle after concert on Tuesday  Now swollen and painful to walk and caught himself on left hand, wrist pain

## 2016-09-15 NOTE — Discharge Instructions (Signed)
Take your medication as prescribed as needed for pain relief. I recommend resting, elevating and applying ice for 15-20 minutes 3-4 times daily. I recommend follow-up with the orthopedic clinic listed below her symptoms have not improved over the next week. Return to the emergency department if symptoms worsen or new onset of redness, swelling, warmth, numbness, tingling, weakness, decreased range of motion.

## 2016-09-15 NOTE — ED Triage Notes (Signed)
Pt sts left ankle pain after jumping off stage on Tuesday and c/o left knuckle pain

## 2017-01-27 ENCOUNTER — Other Ambulatory Visit: Payer: Self-pay | Admitting: Family Medicine

## 2017-01-27 MED ORDER — CLONIDINE HCL 0.1 MG PO TABS: 0.1000 mg | ORAL_TABLET | Freq: Every day | ORAL | 0 refills | Status: DC

## 2017-01-27 NOTE — Telephone Encounter (Signed)
Last office visit 09/28/2015.  No future appointments scheduled.  Refill?

## 2017-01-27 NOTE — Telephone Encounter (Signed)
Spoke to pt. Advised him he would have to make an appt in the next 30 days before the next refill. I will do 30 days. He said he will call for an ov

## 2017-03-15 ENCOUNTER — Other Ambulatory Visit: Payer: Self-pay

## 2017-03-15 MED ORDER — CLONIDINE HCL 0.1 MG PO TABS: 0.1000 mg | ORAL_TABLET | Freq: Every day | ORAL | 5 refills | Status: DC

## 2017-03-16 ENCOUNTER — Other Ambulatory Visit: Payer: Self-pay

## 2017-03-16 MED ORDER — CLONIDINE HCL 0.1 MG PO TABS: 0.1000 mg | ORAL_TABLET | Freq: Every day | ORAL | 1 refills | Status: DC

## 2017-05-17 ENCOUNTER — Encounter: Payer: Self-pay | Admitting: Family Medicine

## 2017-05-17 ENCOUNTER — Ambulatory Visit (INDEPENDENT_AMBULATORY_CARE_PROVIDER_SITE_OTHER): Payer: Self-pay | Admitting: Family Medicine

## 2017-05-17 VITALS — BP 140/90 | HR 122 | Temp 98.9°F | Wt 126.0 lb

## 2017-05-17 DIAGNOSIS — F419 Anxiety disorder, unspecified: Secondary | ICD-10-CM

## 2017-05-17 DIAGNOSIS — L509 Urticaria, unspecified: Secondary | ICD-10-CM | POA: Insufficient documentation

## 2017-05-17 MED ORDER — HYDROXYZINE HCL 10 MG PO TABS: 10.0000 mg | ORAL_TABLET | Freq: Three times a day (TID) | ORAL | 2 refills | Status: DC | PRN

## 2017-05-17 NOTE — Assessment & Plan Note (Signed)
Intermittent, none on exam today. Anxiety vs allergic reaction. Advised allergy referral but he declined. I advised that given his heroin overdose, I am not comfortable prescribing Klonipin but I have refilled his vistaril. The patient indicates understanding of these issues and agrees with the plan.

## 2017-05-17 NOTE — Progress Notes (Signed)
Subjective:   Patient ID: Todd Hanna, male    DOB: [birth year], 31 y.o.   MRN: [id number]  Todd Hanna is a pleasant 31 y.o. year old male who presents to clinic today with Acute Visit (hives  recurrent since April covers body) and Medication Management  on 05/17/2017  HPI:  Hives- has been getting "full body," very itchy hives intermittently since April.  No changes in soaps or detergents.  Benadryl does help "somewhat."  No sensation of lips or throat swelling or closing shut.  Never had anything like this before.  He is under a lot of stress- remains in custody battle for his daughter.  He thinks hives are due to stress and asking for a refill of his klonipin.  Current Outpatient Prescriptions on File Prior to Visit  Medication Sig Dispense Refill  . cloNIDine (CATAPRES) 0.1 MG tablet Take 1 tablet (0.1 mg total) by mouth daily. 90 tablet 1   No current facility-administered medications on file prior to visit.     Allergies  Allergen Reactions  . Amoxicillin Hives  . Penicillins Hives    Past Medical History:  Diagnosis Date  . Anxiety   . Epididymitis, right 03/25/2015  . History of stomach ulcers   . Hypertension   . Myocardial infarction ([place])   . Nephrolithiasis   . Pneumonia ~ 2011  . Ulcerative colitis 2011   colonoscopy at [facility]    Past Surgical History:  Procedure Laterality Date  . COLONOSCOPY  2011   dx'd ulcerative colitis  . LITHOTRIPSY  2011    Family History  Problem Relation Age of Onset  . Hypertension Mother     Social History   Social History  . Marital status: Single    Spouse name: N/A  . Number of children: 1  . Years of education: N/A   Occupational History  . 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 Smoking status: Current Every Day Smoker    Packs/day: 0.60    Years: 13.00    Types: Cigarettes  . Smokeless tobacco: Never Used  . Alcohol use 2.4 oz/week    4 Cans of beer per week  . Drug use: No  .  Sexual activity: Yes   Other Topics Concern  . Not on file   Social History Narrative  . No narrative on file   The PMH, PSH, Social History, Family History, Medications, and allergies have been reviewed in [facility], and have been updated if relevant.     Review of Systems  Skin: Positive for rash.  Psychiatric/Behavioral: Negative for agitation, behavioral problems, confusion, decreased concentration, dysphoric mood, hallucinations, self-injury, sleep disturbance and suicidal ideas. The patient is nervous/anxious. The patient is not hyperactive.   All other systems reviewed and are negative.      Objective:    BP 140/90   Pulse (!) 122   Temp 98.9 F (37.2 C)   Wt 126 lb (57.2 kg)   SpO2 99%   BMI 19.73 kg/m    Physical Exam  Constitutional: He is oriented to person, place, and time. He appears well-developed and well-nourished. No distress.  HENT:  Head: Normocephalic and atraumatic.  Eyes: Conjunctivae are normal.  Cardiovascular: Normal rate.   Pulmonary/Chest: Effort normal.  Musculoskeletal: Normal range of motion.  Neurological: He is alert and oriented to person, place, and time. No cranial nerve deficit.  Skin: Skin is warm and dry. He is not diaphoretic.  No hives on exam today  Psychiatric: He has a normal mood and affect. His behavior is normal. Judgment and thought content normal.  Nursing note and vitals reviewed.         Assessment & Plan:   Anxiety  Hives No Follow-up on file.

## 2018-03-25 ENCOUNTER — Other Ambulatory Visit: Payer: Self-pay | Admitting: Family Medicine

## 2018-03-26 NOTE — Telephone Encounter (Signed)
Pt must call and sched OV first/according to chart looks like pt has been out a while/thx dmf

## 2018-07-10 ENCOUNTER — Ambulatory Visit: Payer: Self-pay | Admitting: Family Medicine

## 2018-07-10 ENCOUNTER — Encounter: Payer: Self-pay | Admitting: Family Medicine

## 2018-07-10 VITALS — BP 144/80 | HR 99 | Temp 98.4°F | Ht 67.0 in | Wt 120.8 lb

## 2018-07-10 DIAGNOSIS — F419 Anxiety disorder, unspecified: Secondary | ICD-10-CM

## 2018-07-10 DIAGNOSIS — I10 Essential (primary) hypertension: Secondary | ICD-10-CM

## 2018-07-10 MED ORDER — CLONIDINE HCL 0.1 MG PO TABS: 0.1000 mg | ORAL_TABLET | Freq: Every day | ORAL | 2 refills | Status: DC

## 2018-07-10 MED ORDER — HYDROXYZINE HCL 10 MG PO TABS: 10.0000 mg | ORAL_TABLET | Freq: Three times a day (TID) | ORAL | 2 refills | Status: DC | PRN

## 2018-07-10 NOTE — Patient Instructions (Signed)
Great to see you. We are restarting clonidine and vistiral.

## 2018-07-10 NOTE — Progress Notes (Signed)
Subjective:   Patient ID: Todd Hanna, male    DOB: 05-11-1986, 32 y.o.   MRN: [id number]  Todd Hanna is a pleasant 32 y.o. year old male who presents to clinic today with Hypertension (Patient is here today C/O BP being very high.  Was 167/80 about a month ago. Has been without medication over a year.  )  on 07/10/2018  HPI:  Last saw patient on 05/17/17.  Note reviewed. At that time, his anxiety had deteriorated. He had been developing hives.  He had been out of both Klonipin and Hydroxizine, I advised that given his history of a heroin overdose, I was not comfortable prescribing xanax but I would refill his klonipin.  Has also tried zoloft, paxil, celexa, lexapro, buspar and remeron.  HTN- has been taking catapress 0.1 mg twice daily.  He has been out of his Catapress for over a year.  BP was 167/80 a month ago.  He does not have health insurance.  Lab Results  Component Value Date   CREATININE 1.03 06/26/2015   BP Readings from Last 3 Encounters:  07/10/18 (!) 144/80  05/17/17 140/90  09/15/16 125/83      Current Outpatient Medications on File Prior to Visit  Medication Sig Dispense Refill  . Acetaminophen (TYLENOL 8 HOUR PO) Take by mouth as needed.    . cloNIDine (CATAPRES) 0.1 MG tablet Take 1 tablet (0.1 mg total) by mouth daily. (Patient not taking: Reported on 07/10/2018) 90 tablet 1  . hydrOXYzine (ATARAX/VISTARIL) 10 MG tablet Take 1 tablet (10 mg total) by mouth every 8 (eight) hours as needed. for anxiety (Patient not taking: Reported on 07/10/2018) 60 tablet 2   No current facility-administered medications on file prior to visit.     Allergies  Allergen Reactions  . Amoxicillin Hives  . Penicillins Hives    Past Medical History:  Diagnosis Date  . Anxiety   . Epididymitis, right 03/25/2015  . History of stomach ulcers   . Hypertension   . Myocardial infarction ([facility])   . Nephrolithiasis   . Pneumonia ~ 2011  . Ulcerative colitis 2011   colonoscopy  at [facility]    Past Surgical History:  Procedure Laterality Date  . COLONOSCOPY  2011   dx'd ulcerative colitis  . LITHOTRIPSY  2011    Family History  Problem Relation Age of Onset  . Hypertension Mother     Social History   Socioeconomic History  . Marital status: Single    Spouse name: Not on file  . Number of children: 1  . Years of education: Not on file  . Highest education level: Not on file  Occupational History  . Occupation: Psychologist, sport and exercise: [employer]  Social Needs  . Financial resource strain: Not on file  . Food insecurity:    Worry: Not on file    Inability: Not on file  . Transportation needs:    Medical: Not on file    Non-medical: Not on file  Tobacco Use  . Smoking status: Current Every Day Smoker    Packs/day: 0.60    Years: 13.00    Pack years: 7.80    Types: Cigarettes  . Smokeless tobacco: Never Used  Substance and Sexual Activity  . Alcohol use: Yes    Alcohol/week: 4.0 standard drinks    Types: 4 Cans of beer per week  . Drug use: No  . Sexual activity: Yes  Lifestyle  . Physical activity:    Days  per week: Not on file    Minutes per session: Not on file  . Stress: Not on file  Relationships  . Social connections:    Talks on phone: Not on file    Gets together: Not on file    Attends religious service: Not on file    Active member of club or organization: Not on file    Attends meetings of clubs or organizations: Not on file    Relationship status: Not on file  . Intimate partner violence:    Fear of current or ex partner: Not on file    Emotionally abused: Not on file    Physically abused: Not on file    Forced sexual activity: Not on file  Other Topics Concern  . Not on file  Social History Narrative  . Not on file   The PMH, PSH, Social History, Family History, Medications, and allergies have been reviewed in [facility], and have been updated if relevant.   Review of Systems  Constitutional: Negative.   HENT: Negative.    Eyes: Negative.   Respiratory: Negative.   Cardiovascular: Negative.   Gastrointestinal: Negative.   Endocrine: Negative.   Genitourinary: Negative.   Musculoskeletal: Negative.   Skin: Negative.   Allergic/Immunologic: Negative.   Neurological: Positive for dizziness. Negative for facial asymmetry, light-headedness and headaches.  Hematological: Negative.   Psychiatric/Behavioral: Negative.   All other systems reviewed and are negative.      Objective:    BP (!) 144/80 (BP Location: Left Arm, Patient Position: Sitting, Cuff Size: Normal)   Pulse 99   Temp 98.4 F (36.9 C) (Oral)   Ht 5\' 7"  (1.702 m)   Wt 120 lb 12.8 oz (54.8 kg)   SpO2 97%   BMI 18.92 kg/m    Physical Exam  Constitutional: He is oriented to person, place, and time. He appears well-developed and well-nourished. No distress.  HENT:  Head: Normocephalic and atraumatic.  Eyes: EOM are normal.  Neck: Normal range of motion.  Cardiovascular: Normal rate and regular rhythm.  Pulmonary/Chest: Effort normal.  Musculoskeletal: Normal range of motion. He exhibits no edema.  Neurological: He is alert and oriented to person, place, and time. No cranial nerve deficit.  Skin: Skin is warm and dry. He is not diaphoretic.  Psychiatric: His speech is normal and behavior is normal. Judgment and thought content normal. His mood appears anxious. Cognition and memory are normal.  Nursing note and vitals reviewed.         Assessment & Plan:   Anxiety  Essential hypertension No follow-ups on file.

## 2018-07-10 NOTE — Assessment & Plan Note (Signed)
Deteriorated off clonidine- restart rx. He will check BP at home- he does not have health insurance. He will call me when he does get health insurance and schedule a cpx/labs at that time

## 2018-12-21 ENCOUNTER — Other Ambulatory Visit: Payer: Self-pay | Admitting: Family Medicine

## 2018-12-26 NOTE — Telephone Encounter (Signed)
Pt following up on request  cloNIDine (CATAPRES) 0.1 MG tablet.  Pt states he is out and feeling a little nervous.   [place], [place] - [address] [phone] (Phone) [phone] (Fax)

## 2018-12-27 NOTE — Telephone Encounter (Signed)
Rx was sent in.

## 2019-04-15 ENCOUNTER — Other Ambulatory Visit: Payer: Self-pay | Admitting: Family Medicine

## 2019-04-21 ENCOUNTER — Other Ambulatory Visit: Payer: Self-pay | Admitting: Family Medicine

## 2019-04-22 NOTE — Telephone Encounter (Signed)
Dr. Deborra Medina please advise, last ov was 07/2018--no future appt made.

## 2019-07-19 ENCOUNTER — Other Ambulatory Visit: Payer: Self-pay

## 2019-07-19 DIAGNOSIS — Z20822 Contact with and (suspected) exposure to covid-19: Secondary | ICD-10-CM

## 2019-07-21 LAB — NOVEL CORONAVIRUS, NAA: SARS-CoV-2, NAA: NOT DETECTED

## 2019-07-25 ENCOUNTER — Other Ambulatory Visit: Payer: Self-pay

## 2019-07-25 DIAGNOSIS — Z20822 Contact with and (suspected) exposure to covid-19: Secondary | ICD-10-CM

## 2019-07-27 LAB — NOVEL CORONAVIRUS, NAA: SARS-CoV-2, NAA: NOT DETECTED

## 2019-08-25 ENCOUNTER — Other Ambulatory Visit: Payer: Self-pay | Admitting: Family Medicine

## 2020-01-17 DIAGNOSIS — I10 Essential (primary) hypertension: Secondary | ICD-10-CM | POA: Insufficient documentation

## 2020-01-17 DIAGNOSIS — F419 Anxiety disorder, unspecified: Secondary | ICD-10-CM | POA: Insufficient documentation

## 2020-03-16 ENCOUNTER — Other Ambulatory Visit: Payer: Self-pay

## 2020-03-17 ENCOUNTER — Ambulatory Visit (INDEPENDENT_AMBULATORY_CARE_PROVIDER_SITE_OTHER): Payer: 59 | Admitting: Family Medicine

## 2020-03-17 ENCOUNTER — Encounter: Payer: Self-pay | Admitting: Family Medicine

## 2020-03-17 VITALS — BP 164/92 | HR 105 | Temp 98.3°F | Ht 67.0 in | Wt 117.6 lb

## 2020-03-17 DIAGNOSIS — Z720 Tobacco use: Secondary | ICD-10-CM | POA: Diagnosis not present

## 2020-03-17 DIAGNOSIS — F1911 Other psychoactive substance abuse, in remission: Secondary | ICD-10-CM | POA: Diagnosis not present

## 2020-03-17 DIAGNOSIS — I10 Essential (primary) hypertension: Secondary | ICD-10-CM | POA: Diagnosis not present

## 2020-03-17 MED ORDER — CLONIDINE 0.1 MG/24HR TD PTWK: 0.1000 mg | MEDICATED_PATCH | TRANSDERMAL | 3 refills | Status: DC

## 2020-03-17 NOTE — Patient Instructions (Signed)
Coping with Quitting Smoking  Quitting smoking is a physical and mental challenge. You will face cravings, withdrawal symptoms, and temptation. Before quitting, work with your health care provider to make a plan that can help you cope. Preparation can help you quit and keep you from giving in. How can I cope with cravings? Cravings usually last for 5-10 minutes. If you get through it, the craving will pass. Consider taking the following actions to help you cope with cravings:  Keep your mouth busy: ? Chew sugar-free gum. ? Suck on hard candies or a straw. ? Brush your teeth.  Keep your hands and body busy: ? Immediately change to a different activity when you feel a craving. ? Squeeze or play with a ball. ? Do an activity or a hobby, like making bead jewelry, practicing needlepoint, or working with wood. ? Mix up your normal routine. ? Take a short exercise break. Go for a quick walk or run up and down stairs. ? Spend time in public places where smoking is not allowed.  Focus on doing something kind or helpful for someone else.  Call a friend or family member to talk during a craving.  Join a support group.  Call a quit line, such as 1-800-QUIT-NOW.  Talk with your health care provider about medicines that might help you cope with cravings and make quitting easier for you. How can I deal with withdrawal symptoms? Your body may experience negative effects as it tries to get used to not having nicotine in the system. These effects are called withdrawal symptoms. They may include:  Feeling hungrier than normal.  Trouble concentrating.  Irritability.  Trouble sleeping.  Feeling depressed.  Restlessness and agitation.  Craving a cigarette. To manage withdrawal symptoms:  Avoid places, people, and activities that trigger your cravings.  Remember why you want to quit.  Get plenty of sleep.  Avoid coffee and other caffeinated drinks. These may worsen some of your  symptoms. How can I handle social situations? Social situations can be difficult when you are quitting smoking, especially in the first few weeks. To manage this, you can:  Avoid parties, bars, and other social situations where people might be smoking.  Avoid alcohol.  Leave right away if you have the urge to smoke.  Explain to your family and friends that you are quitting smoking. Ask for understanding and support.  Plan activities with friends or family where smoking is not an option. What are some ways I can cope with stress? Wanting to smoke may cause stress, and stress can make you want to smoke. Find ways to manage your stress. Relaxation techniques can help. For example:  Breathe slowly and deeply, in through your nose and out through your mouth.  Listen to soothing, relaxing music.  Talk with a family member or friend about your stress.  Light a candle.  Soak in a bath or take a shower.  Think about a peaceful place. What are some ways I can prevent weight gain? Be aware that many people gain weight after they quit smoking. However, not everyone does. To keep from gaining weight, have a plan in place before you quit and stick to the plan after you quit. Your plan should include:  Having healthy snacks. When you have a craving, it may help to: ? Eat plain popcorn, crunchy carrots, celery, or other cut vegetables. ? Chew sugar-free gum.  Changing how you eat: ? Eat small portion sizes at meals. ? Eat 4-6 small meals  throughout the day instead of 1-2 large meals a day. ? Be mindful when you eat. Do not watch television or do other things that might distract you as you eat.  Exercising regularly: ? Make time to exercise each day. If you do not have time for a long workout, do short bouts of exercise for 5-10 minutes several times a day. ? Do some form of strengthening exercise, like weight lifting, and some form of aerobic exercise, like running or swimming.  Drinking  plenty of water or other low-calorie or no-calorie drinks. Drink 6-8 glasses of water daily, or as much as instructed by your health care provider. Summary  Quitting smoking is a physical and mental challenge. You will face cravings, withdrawal symptoms, and temptation to smoke again. Preparation can help you as you go through these challenges.  You can cope with cravings by keeping your mouth busy (such as by chewing gum), keeping your body and hands busy, and making calls to family, friends, or a helpline for people who want to quit smoking.  You can cope with withdrawal symptoms by avoiding places where people smoke, avoiding drinks with caffeine, and getting plenty of rest.  Ask your health care provider about the different ways to prevent weight gain, avoid stress, and handle social situations. This information is not intended to replace advice given to you by your health care provider. Make sure you discuss any questions you have with your health care provider. Document Revised: 09/01/2017 Document Reviewed: 09/16/2016 Elsevier Patient Education  2020 [employer].  Managing Your Hypertension Hypertension is commonly called high blood pressure. This is when the force of your blood pressing against the walls of your arteries is too strong. Arteries are blood vessels that carry blood from your heart throughout your body. Hypertension forces the heart to work harder to pump blood, and may cause the arteries to become narrow or stiff. Having untreated or uncontrolled hypertension can cause heart attack, stroke, kidney disease, and other problems. What are blood pressure readings? A blood pressure reading consists of a higher number over a lower number. Ideally, your blood pressure should be below 120/80. The first ("top") number is called the systolic pressure. It is a measure of the pressure in your arteries as your heart beats. The second ("bottom") number is called the diastolic pressure. It  is a measure of the pressure in your arteries as the heart relaxes. What does my blood pressure reading mean? Blood pressure is classified into four stages. Based on your blood pressure reading, your health care provider may use the following stages to determine what type of treatment you need, if any. Systolic pressure and diastolic pressure are measured in a unit called mm Hg. Normal  Systolic pressure: below 655.  Diastolic pressure: below 80. Elevated  Systolic pressure: 374-827.  Diastolic pressure: below 80. Hypertension stage 1  Systolic pressure: 078-675.  Diastolic pressure: 44-92. Hypertension stage 2  Systolic pressure: 010 or above.  Diastolic pressure: 90 or above. What health risks are associated with hypertension? Managing your hypertension is an important responsibility. Uncontrolled hypertension can lead to:  A heart attack.  A stroke.  A weakened blood vessel (aneurysm).  Heart failure.  Kidney damage.  Eye damage.  Metabolic syndrome.  Memory and concentration problems. What changes can I make to manage my hypertension? Hypertension can be managed by making lifestyle changes and possibly by taking medicines. Your health care provider will help you make a plan to bring your blood pressure within  a normal range. Eating and drinking   Eat a diet that is high in fiber and potassium, and low in salt (sodium), added sugar, and fat. An example eating plan is called the DASH (Dietary Approaches to Stop Hypertension) diet. To eat this way: ? Eat plenty of fresh fruits and vegetables. Try to fill half of your plate at each meal with fruits and vegetables. ? Eat whole grains, such as whole wheat pasta, brown rice, or whole grain bread. Fill about one quarter of your plate with whole grains. ? Eat low-fat diary products. ? Avoid fatty cuts of meat, processed or cured meats, and poultry with skin. Fill about one quarter of your plate with lean proteins such as  fish, chicken without skin, beans, eggs, and tofu. ? Avoid premade and processed foods. These tend to be higher in sodium, added sugar, and fat.  Reduce your daily sodium intake. Most people with hypertension should eat less than 1,500 mg of sodium a day.  Limit alcohol intake to no more than 1 drink a day for nonpregnant women and 2 drinks a day for men. One drink equals 12 oz of beer, 5 oz of wine, or 1 oz of hard liquor. Lifestyle  Work with your health care provider to maintain a healthy body weight, or to lose weight. Ask what an ideal weight is for you.  Get at least 30 minutes of exercise that causes your heart to beat faster (aerobic exercise) most days of the week. Activities may include walking, swimming, or biking.  Include exercise to strengthen your muscles (resistance exercise), such as weight lifting, as part of your weekly exercise routine. Try to do these types of exercises for 30 minutes at least 3 days a week.  Do not use any products that contain nicotine or tobacco, such as cigarettes and e-cigarettes. If you need help quitting, ask your health care provider.  Control any long-term (chronic) conditions you have, such as high cholesterol or diabetes. Monitoring  Monitor your blood pressure at home as told by your health care provider. Your personal target blood pressure may vary depending on your medical conditions, your age, and other factors.  Have your blood pressure checked regularly, as often as told by your health care provider. Working with your health care provider  Review all the medicines you take with your health care provider because there may be side effects or interactions.  Talk with your health care provider about your diet, exercise habits, and other lifestyle factors that may be contributing to hypertension.  Visit your health care provider regularly. Your health care provider can help you create and adjust your plan for managing hypertension. Will  I need medicine to control my blood pressure? Your health care provider may prescribe medicine if lifestyle changes are not enough to get your blood pressure under control, and if:  Your systolic blood pressure is 130 or higher.  Your diastolic blood pressure is 80 or higher. Take medicines only as told by your health care provider. Follow the directions carefully. Blood pressure medicines must be taken as prescribed. The medicine does not work as well when you skip doses. Skipping doses also puts you at risk for problems. Contact a health care provider if:  You think you are having a reaction to medicines you have taken.  You have repeated (recurrent) headaches.  You feel dizzy.  You have swelling in your ankles.  You have trouble with your vision. Get help right away if:  You develop  a severe headache or confusion.  You have unusual weakness or numbness, or you feel faint.  You have severe pain in your chest or abdomen.  You vomit repeatedly.  You have trouble breathing. Summary  Hypertension is when the force of blood pumping through your arteries is too strong. If this condition is not controlled, it may put you at risk for serious complications.  Your personal target blood pressure may vary depending on your medical conditions, your age, and other factors. For most people, a normal blood pressure is less than 120/80.  Hypertension is managed by lifestyle changes, medicines, or both. Lifestyle changes include weight loss, eating a healthy, low-sodium diet, exercising more, and limiting alcohol. This information is not intended to replace advice given to you by your health care provider. Make sure you discuss any questions you have with your health care provider. Document Revised: 01/11/2019 Document Reviewed: 08/17/2016 Elsevier Patient Education  [place]. Clonidine Skin Patches What is this medicine? CLONIDINE (KLOE ni deen) treats high blood pressure. This  medicine may be used for other purposes; ask your health care provider or pharmacist if you have questions. COMMON BRAND NAME(S): Catapres-TTS What should I tell my health care provider before I take this medicine? They need to know if you have any of these conditions:  kidney disease  an unusual or allergic reaction to clonidine, other medicines, foods, dyes, or preservatives  pregnant or trying to get pregnant  breast-feeding How should I use this medicine? This medicine is for external use only. Follow the directions on the prescription label. Apply the patch to an area of the upper arm or part of the body that is clean, dry and hairless. Avoid injured, irritated, calloused, or scarred areas. Use a different site each time to prevent skin irritation. Do not cut or trim the patch. One patch should last for 7 days. Do not use your medicine more often than directed. Do not stop using except on the advice of your doctor or health care professional. You must gradually reduce the dose or you may get a dangerous increase in blood pressure. Talk to your pediatrician regarding the use of this medicine in children. Special care may be needed. Overdosage: If you think you have taken too much of this medicine contact a poison control center or emergency room at once. NOTE: This medicine is only for you. Do not share this medicine with others. What if I miss a dose? Replace each patch on the same day of each week, or if the patch falls off. If you do forget to change the patch for two or three days, check with your doctor or health care professional. What may interact with this medicine? Do not take this medicine with any of the following medications:  MAOIs like Carbex, Eldepryl, Marplan, Nardil, and Parnate This medicine may also interact with the following medications:  barbiturate medicines for inducing sleep or treating seizures like phenobarbital  certain medicines for blood pressure, heart  disease, irregular heart beat  certain medicines for depression, anxiety, or psychotic disturbances  prescription pain medicines This list may not describe all possible interactions. Give your health care provider a list of all the medicines, herbs, non-prescription drugs, or dietary supplements you use. Also tell them if you smoke, drink alcohol, or use illegal drugs. Some items may interact with your medicine. What should I watch for while using this medicine? Visit your doctor or health care professional for regular checks on your progress. Check your  heart rate and blood pressure regularly while you are using this medicine. Ask your doctor or health care professional what your heart rate should be and when you should contact him or her. You can shower or bathe with the skin patch in position. If the patch gets loose, cover it with the extra adhesive overlay provided. You may get drowsy or dizzy. Do not drive, use machinery, or do anything that needs mental alertness until you know how this medicine affects you. To avoid dizzy or fainting spells, do not stand or sit up quickly, especially if you are an older person. Alcohol can make you more drowsy and dizzy. Avoid alcoholic drinks. Your mouth may get dry. Chewing sugarless gum or sucking hard candy, and drinking plenty of water will help. Do not treat yourself for coughs, colds, or pain while you are using this medicine without asking your doctor or health care professional for advice. Some ingredients may increase your blood pressure. If you are going to have surgery tell your doctor or health care professional that you are using this medicine. If you are going to have a magnetic resonance imaging (MRI) procedure, tell your MRI technician if you have this patch on your body. It must be removed before a MRI. What side effects may I notice from receiving this medicine? Side effects that you should report to your doctor or health care professional  as soon as possible:  allergic reactions like skin rash, itching or hives, swelling of the face, lips, or tongue  anxiety, nervousness  chest pain  depression  fast, irregular heartbeat  swelling of feet or legs  unusually weak or tired Side effects that usually do not require medical attention (report to your doctor or health care professional if they continue or are bothersome):  change in sex drive or performance  constipation  headache  skin redness, irritation, or darkening under the patch area This list may not describe all possible side effects. Call your doctor for medical advice about side effects. You may report side effects to FDA at 1-800-FDA-1088. Where should I keep my medicine? Keep out of the reach of children. Store at room temperature between 15 and 30 degrees C (59 to 86 degrees F). Throw away any unused medicine after the expiration date. NOTE: This sheet is a summary. It may not cover all possible information. If you have questions about this medicine, talk to your doctor, pharmacist, or health care provider.  2020 Elsevier/Gold Standard (2019-05-28 87:68:11)

## 2020-03-17 NOTE — Progress Notes (Signed)
New Patient Office Visit  Subjective:  Patient ID: Todd Hanna, male    DOB: 29-Nov-1985  Age: 34 y.o. MRN: [id number]  CC:  Chief Complaint  Patient presents with   Transitions Of Care    TOC from Dr. Deborra Medina, follow up on BP refill on medications. No concerns.     HPI Todd Hanna presents for follow-up of his hypertension and anxiety.  In the past he is taking clonidine which is helped both.  Longstanding history of hypertension age 85.  He has only taking clonidine once daily ago.  He has been out of it for one day. History of substance abuse and was dw Heroin od in 2016. Chart review show ho MI. Could not find documentation of this. He is a smoker.   Past Medical History:  Diagnosis Date   Anxiety    Epididymitis, right 03/25/2015   History of stomach ulcers    Hypertension    Myocardial infarction [facility])    Nephrolithiasis    Pneumonia ~ 2011   Ulcerative colitis 2011   colonoscopy at [facility]    Past Surgical History:  Procedure Laterality Date   COLONOSCOPY  2011   dx'd ulcerative colitis   LITHOTRIPSY  2011    Family History  Problem Relation Age of Onset   Hypertension Mother     Social History   Socioeconomic History   Marital status: Single    Spouse name: Not on file   Number of children: 1   Years of education: Not on file   Highest education level: Not on file  Occupational History   Occupation: Special educational needs teacher: [employer]  Tobacco Use   Smoking status: Current Every Day Smoker    Packs/day: 0.60    Years: 13.00    Pack years: 7.80    Types: Cigarettes   Smokeless tobacco: Never Used  Substance and Sexual Activity   Alcohol use: Yes    Alcohol/week: 4.0 standard drinks    Types: 4 Cans of beer per week   Drug use: No   Sexual activity: Yes  Other Topics Concern   Not on file  Social History Narrative   Not on file   Social Determinants of Health   Financial Resource Strain:    Difficulty of Paying Living  Expenses:   Food Insecurity:    Worried About Charity fundraiser in the Last Year:    Arboriculturist in the Last Year:   Transportation Needs:    Film/video editor (Medical):    Lack of Transportation (Non-Medical):   Physical Activity:    Days of Exercise per Week:    Minutes of Exercise per Session:   Stress:    Feeling of Stress :   Social Connections:    Frequency of Communication with Friends and Family:    Frequency of Social Gatherings with Friends and Family:    Attends Religious Services:    Active Member of Clubs or Organizations:    Attends Music therapist:    Marital Status:   Intimate Partner Violence:    Fear of Current or Ex-Partner:    Emotionally Abused:    Physically Abused:    Sexually Abused:     ROS Review of Systems  Constitutional: Negative.   HENT: Negative.   Respiratory: Negative.   Cardiovascular: Negative.   Gastrointestinal: Negative.   Genitourinary: Negative.   Skin: Negative for pallor and rash.  Allergic/Immunologic: Negative for immunocompromised state.  Neurological: Negative for light-headedness and numbness.  Hematological: Does not bruise/bleed easily.  Psychiatric/Behavioral: The patient is nervous/anxious.     Objective:   Today's Vitals: BP (!) 164/92    Pulse (!) 105    Temp 98.3 F (36.8 C) (Tympanic)    Ht 5' 7"  (1.702 m)    Wt 117 lb 9.6 oz (53.3 kg)    SpO2 95%    BMI 18.42 kg/m   Physical Exam Vitals and nursing note reviewed.  Constitutional:      General: He is not in acute distress.    Appearance: Normal appearance. He is normal weight. He is not ill-appearing, toxic-appearing or diaphoretic.  HENT:     Head: Normocephalic and atraumatic.     Right Ear: Tympanic membrane, ear canal and external ear normal.     Left Ear: Tympanic membrane, ear canal and external ear normal.  Eyes:     General: No scleral icterus.       Right eye: No discharge.        Left eye: No  discharge.     Extraocular Movements: Extraocular movements intact.     Conjunctiva/sclera: Conjunctivae normal.     Pupils: Pupils are equal, round, and reactive to light.  Cardiovascular:     Rate and Rhythm: Normal rate and regular rhythm.  Pulmonary:     Effort: Pulmonary effort is normal.     Breath sounds: Normal breath sounds.  Musculoskeletal:     Cervical back: No rigidity or tenderness.  Lymphadenopathy:     Cervical: No cervical adenopathy.  Skin:    General: Skin is warm and dry.  Neurological:     Mental Status: He is alert and oriented to person, place, and time.  Psychiatric:        Mood and Affect: Mood normal.        Behavior: Behavior normal.     Assessment & Plan:   Problem List Items Addressed This Visit      Cardiovascular and Mediastinum   Essential hypertension - Primary   Relevant Medications   cloNIDine (CATAPRES - DOSED IN MG/24 HR) 0.1 mg/24hr patch     Other   Tobacco use   History of substance abuse [facility])      Outpatient Encounter Medications as of 03/17/2020  Medication Sig   cloNIDine (CATAPRES) 0.1 MG tablet Take 1 tablet by mouth once daily   Acetaminophen (TYLENOL 8 HOUR PO) Take by mouth as needed. (Patient not taking: Reported on 03/17/2020)   cloNIDine (CATAPRES - DOSED IN MG/24 HR) 0.1 mg/24hr patch Place 1 patch (0.1 mg total) onto the skin once a week.   hydrOXYzine (ATARAX/VISTARIL) 10 MG tablet TAKE 1 TABLET BY MOUTH EVERY 8 HOURS AS NEEDED FOR ANXIETY (Patient not taking: Reported on 03/17/2020)   No facility-administered encounter medications on file as of 03/17/2020.    Follow-up: Return in about 3 months (around 06/17/2020), or return fasting for physical exam.  Will discuss alcohol usage with ho substance abuse. Follow up with cardiology? Advised patient not to stop clonidine abruptly. This drug is also helpful for his anxiety. Given info on coping with quitting smoking, managing htn and clonidine skin patches.  Libby Maw, MD

## 2020-06-15 ENCOUNTER — Other Ambulatory Visit: Payer: Self-pay

## 2020-06-16 ENCOUNTER — Ambulatory Visit (INDEPENDENT_AMBULATORY_CARE_PROVIDER_SITE_OTHER): Payer: 59 | Admitting: Family Medicine

## 2020-06-16 ENCOUNTER — Encounter: Payer: Self-pay | Admitting: Family Medicine

## 2020-06-16 VITALS — BP 134/82 | HR 99 | Temp 97.5°F | Ht 67.5 in | Wt 118.4 lb

## 2020-06-16 DIAGNOSIS — Z0000 Encounter for general adult medical examination without abnormal findings: Secondary | ICD-10-CM | POA: Diagnosis not present

## 2020-06-16 DIAGNOSIS — L509 Urticaria, unspecified: Secondary | ICD-10-CM | POA: Diagnosis not present

## 2020-06-16 DIAGNOSIS — I10 Essential (primary) hypertension: Secondary | ICD-10-CM | POA: Diagnosis not present

## 2020-06-16 DIAGNOSIS — K4090 Unilateral inguinal hernia, without obstruction or gangrene, not specified as recurrent: Secondary | ICD-10-CM

## 2020-06-16 DIAGNOSIS — Z720 Tobacco use: Secondary | ICD-10-CM | POA: Diagnosis not present

## 2020-06-16 DIAGNOSIS — I252 Old myocardial infarction: Secondary | ICD-10-CM

## 2020-06-16 LAB — COMPREHENSIVE METABOLIC PANEL
ALT: 67 U/L — ABNORMAL HIGH (ref 0–53)
AST: 110 U/L — ABNORMAL HIGH (ref 0–37)
Albumin: 4.6 g/dL (ref 3.5–5.2)
Alkaline Phosphatase: 95 U/L (ref 39–117)
BUN: 9 mg/dL (ref 6–23)
CO2: 30 mEq/L (ref 19–32)
Calcium: 9.4 mg/dL (ref 8.4–10.5)
Chloride: 98 mEq/L (ref 96–112)
Creatinine, Ser: 0.78 mg/dL (ref 0.40–1.50)
GFR: 113.46 mL/min (ref >60.00)
Glucose, Bld: 83 mg/dL (ref 70–99)
Potassium: 4.2 mEq/L (ref 3.5–5.1)
Sodium: 136 mEq/L (ref 135–145)
Total Bilirubin: 0.7 mg/dL (ref 0.2–1.2)
Total Protein: 7.4 g/dL (ref 6.0–8.3)

## 2020-06-16 LAB — HEMOGLOBIN A1C: Hgb A1c MFr Bld: 5.4 % (ref 4.6–6.5)

## 2020-06-16 LAB — CBC
HCT: 43.4 % (ref 39.0–52.0)
Hemoglobin: 15.2 g/dL (ref 13.0–17.0)
MCHC: 35.1 g/dL (ref 30.0–36.0)
MCV: 100 fl (ref 78.0–100.0)
Platelets: 344 10*3/uL (ref 150.0–400.0)
RBC: 4.34 Mil/uL (ref 4.22–5.81)
RDW: 13.5 % (ref 11.5–15.5)
WBC: 7.5 10*3/uL (ref 4.0–10.5)

## 2020-06-16 LAB — URINALYSIS, ROUTINE W REFLEX MICROSCOPIC
Bilirubin Urine: NEGATIVE
Hgb urine dipstick: NEGATIVE
Ketones, ur: NEGATIVE
Leukocytes,Ua: NEGATIVE
Nitrite: NEGATIVE
RBC / HPF: NONE SEEN (ref 0–?)
Specific Gravity, Urine: 1.015 (ref 1.000–1.030)
Total Protein, Urine: NEGATIVE
Urine Glucose: NEGATIVE
Urobilinogen, UA: 0.2 (ref 0.0–1.0)
pH: 8.5 — AB (ref 5.0–8.0)

## 2020-06-16 LAB — LIPID PANEL
Cholesterol: 220 mg/dL — ABNORMAL HIGH (ref 0–200)
HDL: 102.9 mg/dL (ref >39.00)
NonHDL: 117.13
Total CHOL/HDL Ratio: 2
Triglycerides: 328 mg/dL — ABNORMAL HIGH (ref 0.0–149.0)
VLDL: 65.6 mg/dL — ABNORMAL HIGH (ref 0.0–40.0)

## 2020-06-16 LAB — TSH: TSH: 2.51 u[IU]/mL (ref 0.35–4.50)

## 2020-06-16 LAB — LDL CHOLESTEROL, DIRECT: Direct LDL: 55 mg/dL

## 2020-06-16 MED ORDER — CETIRIZINE HCL 10 MG PO TABS: 10.0000 mg | ORAL_TABLET | Freq: Two times a day (BID) | ORAL | 0 refills | Status: DC

## 2020-06-16 MED ORDER — CLONIDINE 0.1 MG/24HR TD PTWK
0.1000 mg | MEDICATED_PATCH | TRANSDERMAL | 3 refills | Status: DC
Start: 2020-06-16 — End: 2021-06-28

## 2020-06-16 NOTE — Progress Notes (Signed)
Established Patient Office Visit  Subjective:  Patient ID: Todd Hanna, male    DOB: September 15, 1986  Age: 34 y.o. MRN: [id number]  CC:  Chief Complaint  Patient presents with  . Annual Exam    CPE, fasting this am, radomally gets hives.    HPI [name] presents for a physical exam.  He is doing well with the clonidine patch.  Denies drowsiness or dry mouth.  Seems to be working well for him.  Tells that he randomly experiences urticaria.  These wheals come up in random places and migrate they do itch.  He cannot identify inciting factors.  He works in a [employer] and often times obtains 20,000 steps.  He lives with his significant other.  The relationship is good.  He does smoke a half a pack of cigarettes a day.  He does not use illegal drugs.  He does have a past history of substance abuse.  He has not used in years.  He was told that he experienced an MI after taking a large dose of caffeine.  He has never had an echocardiogram.  He drinks no more than 4 beers weekly.  Past Medical History:  Diagnosis Date  . Anxiety   . Epididymitis, right 03/25/2015  . History of stomach ulcers   . Hypertension   . Myocardial infarction ([place])   . Nephrolithiasis   . Pneumonia ~ 2011  . Ulcerative colitis 2011   colonoscopy at [facility]    Past Surgical History:  Procedure Laterality Date  . COLONOSCOPY  2011   dx'd ulcerative colitis  . LITHOTRIPSY  2011    Family History  Problem Relation Age of Onset  . Hypertension Mother     Social History   Socioeconomic History  . Marital status: Single    Spouse name: Not on file  . Number of children: 1  . Years of education: Not on file  . Highest education level: Not on file  Occupational History  . Occupation: Special educational needs teacher: [employer]  Tobacco Use  . Smoking status: Current Every Day Smoker    Packs/day: 0.60    Years: 13.00    Pack years: 7.80    Types: Cigarettes  . Smokeless tobacco: Never Used  Vaping Use  . Vaping  Use: Never used  Substance and Sexual Activity  . Alcohol use: Yes    Alcohol/week: 4.0 standard drinks    Types: 4 Cans of beer per week  . Drug use: No  . Sexual activity: Yes  Other Topics Concern  . Not on file  Social History Narrative  . Not on file   Social Determinants of Health   Financial Resource Strain:   . Difficulty of Paying Living Expenses: Not on file  Food Insecurity:   . Worried About Charity fundraiser in the Last Year: Not on file  . Ran Out of Food in the Last Year: Not on file  Transportation Needs:   . Lack of Transportation (Medical): Not on file  . Lack of Transportation (Non-Medical): Not on file  Physical Activity:   . Days of Exercise per Week: Not on file  . Minutes of Exercise per Session: Not on file  Stress:   . Feeling of Stress : Not on file  Social Connections:   . Frequency of Communication with Friends and Family: Not on file  . Frequency of Social Gatherings with Friends and Family: Not on file  . Attends Religious Services: Not  on file  . Active Member of Clubs or Organizations: Not on file  . Attends Archivist Meetings: Not on file  . Marital Status: Not on file  Intimate Partner Violence:   . Fear of Current or Ex-Partner: Not on file  . Emotionally Abused: Not on file  . Physically Abused: Not on file  . Sexually Abused: Not on file    Outpatient Medications Prior to Visit  Medication Sig Dispense Refill  . cloNIDine (CATAPRES - DOSED IN MG/24 HR) 0.1 mg/24hr patch Place 1 patch (0.1 mg total) onto the skin once a week. 4 patch 3  . cloNIDine (CATAPRES) 0.1 MG tablet Take 1 tablet by mouth once daily (Patient not taking: Reported on 06/16/2020) 90 tablet 0   No facility-administered medications prior to visit.    Allergies  Allergen Reactions  . Amoxicillin Hives  . Penicillins Hives    ROS Review of Systems  Constitutional: Negative.   HENT: Negative.   Eyes: Negative for photophobia and visual  disturbance.  Respiratory: Negative.  Negative for shortness of breath.   Cardiovascular: Negative for chest pain and palpitations.  Gastrointestinal: Negative.   Endocrine: Negative for polyphagia and polyuria.  Musculoskeletal: Negative for gait problem and joint swelling.  Skin: Negative for pallor and rash.  Allergic/Immunologic: Negative for immunocompromised state.  Neurological: Negative for light-headedness and numbness.  Hematological: Negative.   Psychiatric/Behavioral: Negative.       Objective:    Physical Exam Vitals and nursing note reviewed.  Constitutional:      General: He is not in acute distress.    Appearance: Normal appearance. He is normal weight. He is not ill-appearing or toxic-appearing.  HENT:     Head: Normocephalic and atraumatic.     Right Ear: Tympanic membrane, ear canal and external ear normal.     Left Ear: Tympanic membrane and external ear normal.     Mouth/Throat:     Dentition: Abnormal dentition.  Eyes:     General:        Right eye: No discharge.        Left eye: No discharge.     Extraocular Movements: Extraocular movements intact.     Conjunctiva/sclera: Conjunctivae normal.     Pupils: Pupils are equal, round, and reactive to light.  Cardiovascular:     Rate and Rhythm: Normal rate and regular rhythm.     Heart sounds: Normal heart sounds.  Pulmonary:     Effort: Pulmonary effort is normal.     Breath sounds: Normal breath sounds.  Abdominal:     General: Abdomen is flat. Bowel sounds are normal. There is no distension.     Palpations: Abdomen is soft. There is no mass.     Tenderness: There is no abdominal tenderness. There is no guarding or rebound.     Hernia: A hernia is present. Hernia is present in the left inguinal area (small). There is no hernia in the right inguinal area.  Genitourinary:    Penis: No hypospadias, erythema, tenderness, discharge, swelling or lesions.      Testes:        Right: Mass, tenderness or  swelling not present. Right testis is descended.        Left: Mass, tenderness or swelling not present. Left testis is descended.     Epididymis:     Right: Not inflamed or enlarged.     Left: Not inflamed or enlarged.  Musculoskeletal:     Cervical back: No rigidity  or tenderness.     Right lower leg: No edema.     Left lower leg: No edema.  Lymphadenopathy:     Cervical: No cervical adenopathy.     Lower Body: No right inguinal adenopathy. No left inguinal adenopathy.  Skin:    General: Skin is warm and dry.  Neurological:     Mental Status: He is alert and oriented to person, place, and time.  Psychiatric:        Mood and Affect: Mood normal.        Behavior: Behavior normal.     BP 134/82   Pulse 99   Temp (!) 97.5 F (36.4 C) (Temporal)   Ht 5' 7.5" (1.715 m)   Wt 118 lb 6.4 oz (53.7 kg)   SpO2 97%   BMI 18.27 kg/m  Wt Readings from Last 3 Encounters:  06/16/20 118 lb 6.4 oz (53.7 kg)  03/17/20 117 lb 9.6 oz (53.3 kg)  07/10/18 120 lb 12.8 oz (54.8 kg)     Health Maintenance Due  Topic Date Due  . Hepatitis C Screening  Never done  . INFLUENZA VACCINE  05/03/2020    There are no preventive care reminders to display for this patient.  No results found for: TSH Lab Results  Component Value Date   WBC 19.1 (H) 06/26/2015   HGB 15.3 06/26/2015   HCT 43.3 06/26/2015   MCV 91.5 06/26/2015   PLT 269 06/26/2015   Lab Results  Component Value Date   NA 136 06/26/2015   K 3.6 06/26/2015   CO2 20 (L) 06/26/2015   GLUCOSE 239 (H) 06/26/2015   BUN 10 06/26/2015   CREATININE 1.03 06/26/2015   BILITOT 0.4 06/26/2015   ALKPHOS 64 06/26/2015   AST 85 (H) 06/26/2015   ALT 44 06/26/2015   PROT 7.3 06/26/2015   ALBUMIN 4.1 06/26/2015   CALCIUM 8.5 (L) 06/26/2015   ANIONGAP 14 06/26/2015   GFR 124.85 01/27/2011   Lab Results  Component Value Date   CHOL 206 (H) 01/27/2011   Lab Results  Component Value Date   HDL 91.60 01/27/2011   No results found  for: [facility] Lab Results  Component Value Date   TRIG 74.0 01/27/2011   Lab Results  Component Value Date   CHOLHDL 2 01/27/2011   No results found for: HGBA1C    Assessment & Plan:   Problem List Items Addressed This Visit      Cardiovascular and Mediastinum   Essential hypertension   Relevant Medications   cloNIDine (CATAPRES - DOSED IN MG/24 HR) 0.1 mg/24hr patch     Musculoskeletal and Integument   Urticaria   Relevant Medications   cetirizine (ZYRTEC ALLERGY) 10 MG tablet     Other   Tobacco use   Healthcare maintenance - Primary   Relevant Orders   CBC   Comprehensive metabolic panel   Hepatitis C antibody   HIV Antibody (routine testing w rflx)   Lipid panel   TSH   Urinalysis, Routine w reflex microscopic   Hemoglobin A1c   Unilateral inguinal hernia without obstruction or gangrene   History of MI (myocardial infarction)   Relevant Orders   Ambulatory referral to Cardiology      Meds ordered this encounter  Medications  . cetirizine (ZYRTEC ALLERGY) 10 MG tablet    Sig: Take 1 tablet (10 mg total) by mouth 2 (two) times daily. As needed for hives.    Dispense:  60 tablet    Refill:  0  . cloNIDine (CATAPRES - DOSED IN MG/24 HR) 0.1 mg/24hr patch    Sig: Place 1 patch (0.1 mg total) onto the skin once a week.    Dispense:  12 patch    Refill:  3    Follow-up: Return in about 6 months (around 12/14/2020), or if symptoms worsen or fail to improve.   Given information on health maintenance and disease prevention.  Also given information on coping with quitting smoking.  Given information on hives or urticaria and inguinal hernia.  We discussed the need for surgical referral and possibly seeing an allergist.  He will let me know. Libby Maw, MD

## 2020-06-16 NOTE — Patient Instructions (Signed)
Coping with Quitting Smoking  Quitting smoking is a physical and mental challenge. You will face cravings, withdrawal symptoms, and temptation. Before quitting, work with your health care provider to make a plan that can help you cope. Preparation can help you quit and keep you from giving in. How can I cope with cravings? Cravings usually last for 5-10 minutes. If you get through it, the craving will pass. Consider taking the following actions to help you cope with cravings:  Keep your mouth busy: ? Chew sugar-free gum. ? Suck on hard candies or a straw. ? Brush your teeth.  Keep your hands and body busy: ? Immediately change to a different activity when you feel a craving. ? Squeeze or play with a ball. ? Do an activity or a hobby, like making bead jewelry, practicing needlepoint, or working with wood. ? Mix up your normal routine. ? Take a short exercise break. Go for a quick walk or run up and down stairs. ? Spend time in public places where smoking is not allowed.  Focus on doing something kind or helpful for someone else.  Call a friend or family member to talk during a craving.  Join a support group.  Call a quit line, such as 1-800-QUIT-NOW.  Talk with your health care provider about medicines that might help you cope with cravings and make quitting easier for you. How can I deal with withdrawal symptoms? Your body may experience negative effects as it tries to get used to not having nicotine in the system. These effects are called withdrawal symptoms. They may include:  Feeling hungrier than normal.  Trouble concentrating.  Irritability.  Trouble sleeping.  Feeling depressed.  Restlessness and agitation.  Craving a cigarette. To manage withdrawal symptoms:  Avoid places, people, and activities that trigger your cravings.  Remember why you want to quit.  Get plenty of sleep.  Avoid coffee and other caffeinated drinks. These may worsen some of your  symptoms. How can I handle social situations? Social situations can be difficult when you are quitting smoking, especially in the first few weeks. To manage this, you can:  Avoid parties, bars, and other social situations where people might be smoking.  Avoid alcohol.  Leave right away if you have the urge to smoke.  Explain to your family and friends that you are quitting smoking. Ask for understanding and support.  Plan activities with friends or family where smoking is not an option. What are some ways I can cope with stress? Wanting to smoke may cause stress, and stress can make you want to smoke. Find ways to manage your stress. Relaxation techniques can help. For example:  Breathe slowly and deeply, in through your nose and out through your mouth.  Listen to soothing, relaxing music.  Talk with a family member or friend about your stress.  Light a candle.  Soak in a bath or take a shower.  Think about a peaceful place. What are some ways I can prevent weight gain? Be aware that many people gain weight after they quit smoking. However, not everyone does. To keep from gaining weight, have a plan in place before you quit and stick to the plan after you quit. Your plan should include:  Having healthy snacks. When you have a craving, it may help to: ? Eat plain popcorn, crunchy carrots, celery, or other cut vegetables. ? Chew sugar-free gum.  Changing how you eat: ? Eat small portion sizes at meals. ? Eat 4-6 small meals  throughout the day instead of 1-2 large meals a day. ? Be mindful when you eat. Do not watch television or do other things that might distract you as you eat.  Exercising regularly: ? Make time to exercise each day. If you do not have time for a long workout, do short bouts of exercise for 5-10 minutes several times a day. ? Do some form of strengthening exercise, like weight lifting, and some form of aerobic exercise, like running or swimming.  Drinking  plenty of water or other low-calorie or no-calorie drinks. Drink 6-8 glasses of water daily, or as much as instructed by your health care provider. Summary  Quitting smoking is a physical and mental challenge. You will face cravings, withdrawal symptoms, and temptation to smoke again. Preparation can help you as you go through these challenges.  You can cope with cravings by keeping your mouth busy (such as by chewing gum), keeping your body and hands busy, and making calls to family, friends, or a helpline for people who want to quit smoking.  You can cope with withdrawal symptoms by avoiding places where people smoke, avoiding drinks with caffeine, and getting plenty of rest.  Ask your health care provider about the different ways to prevent weight gain, avoid stress, and handle social situations. This information is not intended to replace advice given to you by your health care provider. Make sure you discuss any questions you have with your health care provider. Document Revised: 09/01/2017 Document Reviewed: 09/16/2016 Elsevier Patient Education  [place] (urticaria) are itchy, red, swollen areas on the skin. Hives can appear on any part of the body. Hives often fade within 24 hours (acute hives). Sometimes, new hives appear after old ones fade and the cycle can continue for several days or weeks (chronic hives). Hives do not spread from person to person (are not contagious). Hives come from the body's reaction to something a person is allergic to (allergen), something that causes irritation, or various other triggers. When a person is exposed to a trigger, his or her body releases a chemical (histamine) that causes redness, itching, and swelling. Hives can appear right after exposure to a trigger or hours later. What are the causes? This condition may be caused by:  Allergies to foods or ingredients.  Insect bites or stings.  Exposure to pollen or  pets.  Contact with latex or chemicals.  Spending time in sunlight, heat, or cold (exposure).  Exercise.  Stress.  Certain medicines. You can also get hives from other medical conditions and treatments, such as:  Viruses, including the common cold.  Bacterial infections, such as urinary tract infections and strep throat.  Certain medicines.  Allergy shots.  Blood transfusions. Sometimes, the cause of this condition is not known (idiopathic hives). What increases the risk? You are more likely to develop this condition if you:  Are a woman.  Have food allergies, especially to citrus fruits, milk, eggs, peanuts, tree nuts, or shellfish.  Are allergic to: ? Medicines. ? Latex. ? Insects. ? Animals. ? Pollen. What are the signs or symptoms? Common symptoms of this condition include raised, itchy, red or white bumps or patches on your skin. These areas may:  Become large and swollen (welts).  Change in shape and location, quickly and repeatedly.  Be separate hives or connect over a large area of skin.  Sting or become painful.  Turn white when pressed in the center (blanch). In severe cases, yourhands, feet, and  face may also become swollen. This may occur if hives develop deeper in your skin. How is this diagnosed? This condition may be diagnosed by your symptoms, medical history, and physical exam.  Your skin, urine, or blood may be tested to find out what is causing your hives and to rule out other health issues.  Your health care provider may also remove a small sample of skin from the affected area and examine it under a microscope (biopsy). How is this treated? Treatment for this condition depends on the cause and severity of your symptoms. Your health care provider may recommend using cool, wet cloths (cool compresses) or taking cool showers to relieve itching. Treatment may include:  Medicines that help: ? Relieve itching (antihistamines). ? Reduce  swelling (corticosteroids). ? Treat infection (antibiotics).  An injectable medicine (omalizumab). Your health care provider may prescribe this if you have chronic idiopathic hives and you continue to have symptoms even after treatment with antihistamines. Severe cases may require an emergency injection of adrenaline (epinephrine) to prevent a life-threatening allergic reaction (anaphylaxis). Follow these instructions at home: Medicines  Take and apply over-the-counter and prescription medicines only as told by your health care provider.  If you were prescribed an antibiotic medicine, take it as told by your health care provider. Do not stop using the antibiotic even if you start to feel better. Skin care  Apply cool compresses to the affected areas.  Do not scratch or rub your skin. General instructions  Do not take hot showers or baths. This can make itching worse.  Do not wear tight-fitting clothing.  Use sunscreen and wear protective clothing when you are outside.  Avoid any substances that cause your hives. Keep a journal to help track what causes your hives. Write down: ? What medicines you take. ? What you eat and drink. ? What products you use on your skin.  Keep all follow-up visits as told by your health care provider. This is important. Contact a health care provider if:  Your symptoms are not controlled with medicine.  Your joints are painful or swollen. Get help right away if:  You have a fever.  You have pain in your abdomen.  Your tongue or lips are swollen.  Your eyelids are swollen.  Your chest or throat feels tight.  You have trouble breathing or swallowing. These symptoms may represent a serious problem that is an emergency. Do not wait to see if the symptoms will go away. Get medical help right away. Call your local emergency services (911 in the [place] Do not drive yourself to the hospital. Summary  Hives (urticaria) are itchy, red, swollen  areas on your skin. Hives come from the body's reaction to something a person is allergic to (allergen), something that causes irritation, or various other triggers.  Treatment for this condition depends on the cause and severity of your symptoms.  Avoid any substances that cause your hives. Keep a journal to help track what causes your hives.  Take and apply over-the-counter and prescription medicines only as told by your health care provider.  Keep all follow-up visits as told by your health care provider. This is important. This information is not intended to replace advice given to you by your health care provider. Make sure you discuss any questions you have with your health care provider. Document Revised: 04/04/2018 Document Reviewed: 04/04/2018 Elsevier Patient Education  [place] Maintenance, Male Adopting a healthy lifestyle and getting preventive care are important in  promoting health and wellness. Ask your health care provider about:  The right schedule for you to have regular tests and exams.  Things you can do on your own to prevent diseases and keep yourself healthy. What should I know about diet, weight, and exercise? Eat a healthy diet   Eat a diet that includes plenty of vegetables, fruits, low-fat dairy products, and lean protein.  Do not eat a lot of foods that are high in solid fats, added sugars, or sodium. Maintain a healthy weight Body mass index (BMI) is a measurement that can be used to identify possible weight problems. It estimates body fat based on height and weight. Your health care provider can help determine your BMI and help you achieve or maintain a healthy weight. Get regular exercise Get regular exercise. This is one of the most important things you can do for your health. Most adults should:  Exercise for at least 150 minutes each week. The exercise should increase your heart rate and make you sweat (moderate-intensity  exercise).  Do strengthening exercises at least twice a week. This is in addition to the moderate-intensity exercise.  Spend less time sitting. Even light physical activity can be beneficial. Watch cholesterol and blood lipids Have your blood tested for lipids and cholesterol at 35 years of age, then have this test every 5 years. You may need to have your cholesterol levels checked more often if:  Your lipid or cholesterol levels are high.  You are older than 35 years of age.  You are at high risk for heart disease. What should I know about cancer screening? Many types of cancers can be detected early and may often be prevented. Depending on your health history and family history, you may need to have cancer screening at various ages. This may include screening for:  Colorectal cancer.  Prostate cancer.  Skin cancer.  Lung cancer. What should I know about heart disease, diabetes, and high blood pressure? Blood pressure and heart disease  High blood pressure causes heart disease and increases the risk of stroke. This is more likely to develop in people who have high blood pressure readings, are of African descent, or are overweight.  Talk with your health care provider about your target blood pressure readings.  Have your blood pressure checked: ? Every 3-5 years if you are 19-74 years of age. ? Every year if you are 44 years old or older.  If you are between the ages of 59 and 59 and are a current or former smoker, ask your health care provider if you should have a one-time screening for abdominal aortic aneurysm (AAA). Diabetes Have regular diabetes screenings. This checks your fasting blood sugar level. Have the screening done:  Once every three years after age 27 if you are at a normal weight and have a low risk for diabetes.  More often and at a younger age if you are overweight or have a high risk for diabetes. What should I know about preventing infection? Hepatitis  B If you have a higher risk for hepatitis B, you should be screened for this virus. Talk with your health care provider to find out if you are at risk for hepatitis B infection. Hepatitis C Blood testing is recommended for:  Everyone born from [birth year] through 1966.  Anyone with known risk factors for hepatitis C. Sexually transmitted infections (STIs)  You should be screened each year for STIs, including gonorrhea and chlamydia, if: ? You are sexually active and  are younger than 35 years of age. ? You are older than 35 years of age and your health care provider tells you that you are at risk for this type of infection. ? Your sexual activity has changed since you were last screened, and you are at increased risk for chlamydia or gonorrhea. Ask your health care provider if you are at risk.  Ask your health care provider about whether you are at high risk for HIV. Your health care provider may recommend a prescription medicine to help prevent HIV infection. If you choose to take medicine to prevent HIV, you should first get tested for HIV. You should then be tested every 3 months for as long as you are taking the medicine. Follow these instructions at home: Lifestyle  Do not use any products that contain nicotine or tobacco, such as cigarettes, e-cigarettes, and chewing tobacco. If you need help quitting, ask your health care provider.  Do not use street drugs.  Do not share needles.  Ask your health care provider for help if you need support or information about quitting drugs. Alcohol use  Do not drink alcohol if your health care provider tells you not to drink.  If you drink alcohol: ? Limit how much you have to 0-2 drinks a day. ? Be aware of how much alcohol is in your drink. In the [place] one drink equals one 12 oz bottle of beer (355 mL), one 5 oz glass of wine (148 mL), or one 1 oz glass of hard liquor (44 mL). General instructions  Schedule regular health, dental, and eye  exams.  Stay current with your vaccines.  Tell your health care provider if: ? You often feel depressed. ? You have ever been abused or do not feel safe at home. Summary  Adopting a healthy lifestyle and getting preventive care are important in promoting health and wellness.  Follow your health care provider's instructions about healthy diet, exercising, and getting tested or screened for diseases.  Follow your health care provider's instructions on monitoring your cholesterol and blood pressure. This information is not intended to replace advice given to you by your health care provider. Make sure you discuss any questions you have with your health care provider. Document Revised: 09/12/2018 Document Reviewed: 09/12/2018 Elsevier Patient Education  2020 [place] 29-74 Years Old, Male Preventive care refers to lifestyle choices and visits with your health care provider that can promote health and wellness. This includes:  A yearly physical exam. This is also called an annual well check.  Regular dental and eye exams.  Immunizations.  Screening for certain conditions.  Healthy lifestyle choices, such as eating a healthy diet, getting regular exercise, not using drugs or products that contain nicotine and tobacco, and limiting alcohol use. What can I expect for my preventive care visit? Physical exam Your health care provider will check:  Height and weight. These may be used to calculate body mass index (BMI), which is a measurement that tells if you are at a healthy weight.  Heart rate and blood pressure.  Your skin for abnormal spots. Counseling Your health care provider may ask you questions about:  Alcohol, tobacco, and drug use.  Emotional well-being.  Home and relationship well-being.  Sexual activity.  Eating habits.  Work and work Statistician. What immunizations do I need?  Influenza (flu) vaccine  This is recommended every  year. Tetanus, diphtheria, and pertussis (Tdap) vaccine  You may need a Td booster every 10  years. Varicella (chickenpox) vaccine  You may need this vaccine if you have not already been vaccinated. Human papillomavirus (HPV) vaccine  If recommended by your health care provider, you may need three doses over 6 months. Measles, mumps, and rubella (MMR) vaccine  You may need at least one dose of MMR. You may also need a second dose. Meningococcal conjugate (MenACWY) vaccine  One dose is recommended if you are 77-1 years old and a Market researcher living in a residence hall, or if you have one of several medical conditions. You may also need additional booster doses. Pneumococcal conjugate (PCV13) vaccine  You may need this if you have certain conditions and were not previously vaccinated. Pneumococcal polysaccharide (PPSV23) vaccine  You may need one or two doses if you smoke cigarettes or if you have certain conditions. Hepatitis A vaccine  You may need this if you have certain conditions or if you travel or work in places where you may be exposed to hepatitis A. Hepatitis B vaccine  You may need this if you have certain conditions or if you travel or work in places where you may be exposed to hepatitis B. Haemophilus influenzae type b (Hib) vaccine  You may need this if you have certain risk factors. You may receive vaccines as individual doses or as more than one vaccine together in one shot (combination vaccines). Talk with your health care provider about the risks and benefits of combination vaccines. What tests do I need? Blood tests  Lipid and cholesterol levels. These may be checked every 5 years starting at age 31.  Hepatitis C test.  Hepatitis B test. Screening   Diabetes screening. This is done by checking your blood sugar (glucose) after you have not eaten for a while (fasting).  Sexually transmitted disease (STD) testing. Talk with your health care  provider about your test results, treatment options, and if necessary, the need for more tests. Follow these instructions at home: Eating and drinking   Eat a diet that includes fresh fruits and vegetables, whole grains, lean protein, and low-fat dairy products.  Take vitamin and mineral supplements as recommended by your health care provider.  Do not drink alcohol if your health care provider tells you not to drink.  If you drink alcohol: ? Limit how much you have to 0-2 drinks a day. ? Be aware of how much alcohol is in your drink. In the [place] one drink equals one 12 oz bottle of beer (355 mL), one 5 oz glass of wine (148 mL), or one 1 oz glass of hard liquor (44 mL). Lifestyle  Take daily care of your teeth and gums.  Stay active. Exercise for at least 30 minutes on 5 or more days each week.  Do not use any products that contain nicotine or tobacco, such as cigarettes, e-cigarettes, and chewing tobacco. If you need help quitting, ask your health care provider.  If you are sexually active, practice safe sex. Use a condom or other form of protection to prevent STIs (sexually transmitted infections). What's next?  Go to your health care provider once a year for a well check visit.  Ask your health care provider how often you should have your eyes and teeth checked.  Stay up to date on all vaccines. This information is not intended to replace advice given to you by your health care provider. Make sure you discuss any questions you have with your health care provider. Document Revised: 09/13/2018 Document Reviewed: 09/13/2018 Elsevier Patient  Education  [place].  Inguinal Hernia, Adult An inguinal hernia develops when fat or the intestines push through a weak spot in a muscle where your leg meets your lower abdomen (groin). This creates a bulge. This kind of hernia could also be:  In your scrotum, if you are male.  In folds of skin around your vagina, if you are  male. There are three types of inguinal hernias:  Hernias that can be pushed back into the abdomen (are reducible). This type rarely causes pain.  Hernias that are not reducible (are incarcerated).  Hernias that are not reducible and lose their blood supply (are strangulated). This type of hernia requires emergency surgery. What are the causes? This condition is caused by having a weak spot in the muscles or tissues in the groin. This weak spot develops over time. The hernia may poke through the weak spot when you suddenly strain your lower abdominal muscles, such as when you:  Lift a heavy object.  Strain to have a bowel movement. Constipation can lead to straining.  Cough. What increases the risk? This condition is more likely to develop in:  Men.  Pregnant women.  People who: ? Are overweight. ? Work in jobs that require long periods of standing or heavy lifting. ? Have had an inguinal hernia before. ? Smoke or have lung disease. These factors can lead to long-lasting (chronic) coughing. What are the signs or symptoms? Symptoms may depend on the size of the hernia. Often, a small inguinal hernia has no symptoms. Symptoms of a larger hernia may include:  A lump in the groin area. This is easier to see when standing. It might not be visible when lying down.  Pain or burning in the groin. This may get worse when lifting, straining, or coughing.  A dull ache or a feeling of pressure in the groin.  In men, an unusual lump in the scrotum. Symptoms of a strangulated inguinal hernia may include:  A bulge in your groin that is very painful and tender to the touch.  A bulge that turns red or purple.  Fever, nausea, and vomiting.  Inability to have a bowel movement or to pass gas. How is this diagnosed? This condition is diagnosed based on your symptoms, your medical history, and a physical exam. Your health care provider may feel your groin area and ask you to cough. How  is this treated? Treatment depends on the size of your hernia and whether you have symptoms. If you do not have symptoms, your health care provider may have you watch your hernia carefully and have you come in for follow-up visits. If your hernia is large or if you have symptoms, you may need surgery to repair the hernia. Follow these instructions at home: Lifestyle  Avoid lifting heavy objects.  Avoid standing for long periods of time.  Do not use any products that contain nicotine or tobacco, such as cigarettes and e-cigarettes. If you need help quitting, ask your health care provider.  Maintain a healthy weight. Preventing constipation  Take actions to prevent constipation. Constipation leads to straining with bowel movements, which can make a hernia worse or cause a hernia repair to break down. Your health care provider may recommend that you: ? Drink enough fluid to keep your urine pale yellow. ? Eat foods that are high in fiber, such as fresh fruits and vegetables, whole grains, and beans. ? Limit foods that are high in fat and processed sugars, such as fried  or sweet foods. ? Take an over-the-counter or prescription medicine for constipation. General instructions  You may try to push the hernia back in place by very gently pressing on it while lying down. Do not try to force the bulge back in if it will not push in easily.  Watch your hernia for any changes in shape, size, or color. Get help right away if you notice any changes.  Take over-the-counter and prescription medicines only as told by your health care provider.  Keep all follow-up visits as told by your health care provider. This is important. Contact a health care provider if:  You have a fever.  You develop new symptoms.  Your symptoms get worse. Get help right away if:  You have pain in your groin that suddenly gets worse.  You have a bulge in your groin that: ? Suddenly gets bigger and does not get  smaller. ? Becomes red or purple or painful to the touch.  You are a man and you have a sudden pain in your scrotum, or the size of your scrotum suddenly changes.  You cannot push the hernia back in place by very gently pressing on it when you are lying down. Do not try to force the bulge back in if it will not push in easily.  You have nausea or vomiting that does not go away.  You have a fast heartbeat.  You cannot have a bowel movement or pass gas. These symptoms may represent a serious problem that is an emergency. Do not wait to see if the symptoms will go away. Get medical help right away. Call your local emergency services (911 in the [place] Summary  An inguinal hernia develops when fat or the intestines push through a weak spot in a muscle where your leg meets your lower abdomen (groin).  This condition is caused by having a weak spot in muscles or tissue in your groin.  Symptoms may depend on the size of the hernia, and they may include pain or swelling in your groin. A small inguinal hernia often has no symptoms.  Treatment may not be needed if you do not have symptoms. If you have symptoms or a large hernia, you may need surgery to repair the hernia.  Avoid lifting heavy objects. Also avoid standing for long amounts of time. This information is not intended to replace advice given to you by your health care provider. Make sure you discuss any questions you have with your health care provider. Document Revised: 10/21/2017 Document Reviewed: 06/21/2017 Elsevier Patient Education  [place].

## 2020-06-18 ENCOUNTER — Encounter: Payer: Self-pay | Admitting: Family Medicine

## 2020-06-18 LAB — HEPATITIS C ANTIBODY
Hepatitis C Ab: NONREACTIVE
SIGNAL TO CUT-OFF: 0.01 (ref <1.00)

## 2020-06-18 LAB — HIV ANTIBODY (ROUTINE TESTING W REFLEX): HIV 1&2 Ab, 4th Generation: NONREACTIVE

## 2020-06-18 NOTE — Telephone Encounter (Signed)
Needs an ov or virtual to discuss.

## 2020-06-23 ENCOUNTER — Telehealth (INDEPENDENT_AMBULATORY_CARE_PROVIDER_SITE_OTHER): Payer: 59 | Admitting: Family Medicine

## 2020-06-23 ENCOUNTER — Encounter: Payer: Self-pay | Admitting: Family Medicine

## 2020-06-23 VITALS — Ht 67.0 in | Wt 118.0 lb

## 2020-06-23 DIAGNOSIS — E782 Mixed hyperlipidemia: Secondary | ICD-10-CM

## 2020-06-23 DIAGNOSIS — R7989 Other specified abnormal findings of blood chemistry: Secondary | ICD-10-CM

## 2020-06-23 NOTE — Progress Notes (Signed)
Established Patient Office Visit  Subjective:  Patient ID: Todd Hanna, male    DOB: [birth year]  Age: 34 y.o. MRN: [id number]  CC:  Chief Complaint  Patient presents with  . Advice Only    discuss labs     HPI Todd Hanna presents for follow-up of his labs that showed elevated triglycerides elevated LFTs with AST greater than ALT.  Patient confirms that he was fasting overnight for his lipid profile.  He knows of no family history of high triglycerides.  He works in [employer] and eats at [employer].  He does eat high fat foods from time to time.  He does not have a big appetite he tells me and he is always been thin.  He does enjoy 2-4 beers daily on a regular basis.  Past Medical History:  Diagnosis Date  . Anxiety   . Epididymitis, right 03/25/2015  . History of stomach ulcers   . Hypertension   . Myocardial infarction ([place])   . Nephrolithiasis   . Pneumonia ~ 2011  . Ulcerative colitis 2011   colonoscopy at [facility]    Past Surgical History:  Procedure Laterality Date  . COLONOSCOPY  2011   dx'd ulcerative colitis  . LITHOTRIPSY  2011    Family History  Problem Relation Age of Onset  . Hypertension Mother     Social History   Socioeconomic History  . Marital status: Single    Spouse name: Not on file  . Number of children: 1  . Years of education: Not on file  . Highest education level: Not on file  Occupational History  . Occupation: Special educational needs teacher: [employer]  Tobacco Use  . Smoking status: Current Every Day Smoker    Packs/day: 0.60    Years: 13.00    Pack years: 7.80    Types: Cigarettes  . Smokeless tobacco: Never Used  Vaping Use  . Vaping Use: Never used  Substance and Sexual Activity  . Alcohol use: Yes    Alcohol/week: 4.0 standard drinks    Types: 4 Cans of beer per week  . Drug use: No  . Sexual activity: Yes  Other Topics Concern  . Not on file  Social History Narrative  . Not on file   Social Determinants of Health    Financial Resource Strain:   . Difficulty of Paying Living Expenses: Not on file  Food Insecurity:   . Worried About Charity fundraiser in the Last Year: Not on file  . Ran Out of Food in the Last Year: Not on file  Transportation Needs:   . Lack of Transportation (Medical): Not on file  . Lack of Transportation (Non-Medical): Not on file  Physical Activity:   . Days of Exercise per Week: Not on file  . Minutes of Exercise per Session: Not on file  Stress:   . Feeling of Stress : Not on file  Social Connections:   . Frequency of Communication with Friends and Family: Not on file  . Frequency of Social Gatherings with Friends and Family: Not on file  . Attends Religious Services: Not on file  . Active Member of Clubs or Organizations: Not on file  . Attends Archivist Meetings: Not on file  . Marital Status: Not on file  Intimate Partner Violence:   . Fear of Current or Ex-Partner: Not on file  . Emotionally Abused: Not on file  . Physically Abused: Not on file  .  Sexually Abused: Not on file    Outpatient Medications Prior to Visit  Medication Sig Dispense Refill  . cetirizine (ZYRTEC ALLERGY) 10 MG tablet Take 1 tablet (10 mg total) by mouth 2 (two) times daily. As needed for hives. 60 tablet 0  . cloNIDine (CATAPRES - DOSED IN MG/24 HR) 0.1 mg/24hr patch Place 1 patch (0.1 mg total) onto the skin once a week. 12 patch 3   No facility-administered medications prior to visit.    Allergies  Allergen Reactions  . Amoxicillin Hives  . Penicillins Hives    ROS Review of Systems  Constitutional: Negative.   Respiratory: Negative.   Cardiovascular: Negative.   Gastrointestinal: Negative.   Endocrine: Negative for polyphagia and polyuria.  Psychiatric/Behavioral: Negative.       Objective:    Physical Exam Vitals and nursing note reviewed.  Constitutional:      General: He is not in acute distress.    Appearance: Normal appearance. He is normal  weight. He is not ill-appearing, toxic-appearing or diaphoretic.  HENT:     Head: Normocephalic and atraumatic.  Eyes:     General:        Right eye: No discharge.        Left eye: No discharge.     Conjunctiva/sclera: Conjunctivae normal.  Pulmonary:     Effort: Pulmonary effort is normal.  Neurological:     Mental Status: He is alert and oriented to person, place, and time.  Psychiatric:        Mood and Affect: Mood normal.        Behavior: Behavior normal.     Ht 5' 7"  (1.702 m)   Wt 118 lb (53.5 kg)   BMI 18.48 kg/m  Wt Readings from Last 3 Encounters:  06/23/20 118 lb (53.5 kg)  06/16/20 118 lb 6.4 oz (53.7 kg)  03/17/20 117 lb 9.6 oz (53.3 kg)     Health Maintenance Due  Topic Date Due  . INFLUENZA VACCINE  05/03/2020    There are no preventive care reminders to display for this patient.  Lab Results  Component Value Date   TSH 2.51 06/16/2020   Lab Results  Component Value Date   WBC 7.5 06/16/2020   HGB 15.2 06/16/2020   HCT 43.4 06/16/2020   MCV 100.0 06/16/2020   PLT 344.0 06/16/2020   Lab Results  Component Value Date   NA 136 06/16/2020   K 4.2 06/16/2020   CO2 30 06/16/2020   GLUCOSE 83 06/16/2020   BUN 9 06/16/2020   CREATININE 0.78 06/16/2020   BILITOT 0.7 06/16/2020   ALKPHOS 95 06/16/2020   AST 110 (H) 06/16/2020   ALT 67 (H) 06/16/2020   PROT 7.4 06/16/2020   ALBUMIN 4.6 06/16/2020   CALCIUM 9.4 06/16/2020   ANIONGAP 14 06/26/2015   GFR 113.46 06/16/2020   Lab Results  Component Value Date   CHOL 220 (H) 06/16/2020   Lab Results  Component Value Date   HDL 102.90 06/16/2020   No results found for: [facility] Lab Results  Component Value Date   TRIG 328.0 (H) 06/16/2020   Lab Results  Component Value Date   CHOLHDL 2 06/16/2020   Lab Results  Component Value Date   HGBA1C 5.4 06/16/2020      Assessment & Plan:   Problem List Items Addressed This Visit    None    Visit Diagnoses    Elevated triglycerides with  high cholesterol    -  Primary  Elevated LFTs       Relevant Orders   Hepatitis B Surface AntiGEN   US Abdomen Complete      No orders of the defined types were placed in this encounter.   Follow-up: Return in about 4 months (around 10/23/2020).   Given information on high cholesterol and preventing elevated cholesterol.  Agrees to moderate his drinking to no more than 2 beers daily.  Reminded him of his history of substance abuse that he should strongly consider abstaining from alcohol.  Agrees to go for an ultrasound of his liver.   Libby Maw, MD   Virtual Visit via Video Note  I connected with Franchot Gallo on 06/23/20 at  2:30 PM EDT by a video enabled telemedicine application and verified that I am speaking with the correct person using two identifiers.  Location: Patient: at home alone in a room.  Provider:    I discussed the limitations of evaluation and management by telemedicine and the availability of in person appointments. The patient expressed understanding and agreed to proceed.  History of Present Illness:    Observations/Objective:   Assessment and Plan:   Follow Up Instructions:    I discussed the assessment and treatment plan with the patient. The patient was provided an opportunity to ask questions and all were answered. The patient agreed with the plan and demonstrated an understanding of the instructions.   The patient was advised to call back or seek an in-person evaluation if the symptoms worsen or if the condition fails to improve as anticipated.  I provided 20 minutes of non-face-to-face time during this encounter.   Libby Maw, MD

## 2020-06-23 NOTE — Patient Instructions (Addendum)
Preventing High Cholesterol Cholesterol is a white, waxy substance similar to fat that the human body needs to help build cells. The liver makes all the cholesterol that a person's body needs. Having high cholesterol (hypercholesterolemia) increases a person's risk for heart disease and stroke. Extra (excess) cholesterol comes from the food the person eats. High cholesterol can often be prevented with diet and lifestyle changes. If you already have high cholesterol, you can control it with diet and lifestyle changes and with medicine. How can high cholesterol affect me? If you have high cholesterol, deposits (plaques) may build up on the walls of your arteries. The arteries are the blood vessels that carry blood away from your heart. Plaques make the arteries narrower and stiffer. This can limit or block blood flow and cause blood clots to form. Blood clots:  Are tiny balls of cells that form in your blood.  Can move to the heart or brain, causing a heart attack or stroke. Plaques in arteries greatly increase your risk for heart attack and stroke.Making diet and lifestyle changes can reduce your risk for these conditions that may threaten your life. What can increase my risk? This condition is more likely to develop in people who:  Eat foods that are high in saturated fat or cholesterol. Saturated fat is mostly found in: ? Foods that contain animal fat, such as red meat and some dairy products. ? Certain fatty foods made from plants, such as tropical oils.  Are overweight.  Are not getting enough exercise.  Have a family history of high cholesterol. What actions can I take to prevent this? Nutrition   Eat less saturated fat.  Avoid trans fats (partially hydrogenated oils). These are often found in margarine and in some baked goods, fried foods, and snacks bought in packages.  Avoid precooked or cured meat, such as sausages or meat loaves.  Avoid foods and drinks that have added  sugars.  Eat more fruits, vegetables, and whole grains.  Choose healthy sources of protein, such as fish, poultry, lean cuts of red meat, beans, peas, lentils, and nuts.  Choose healthy sources of fat, such as: ? Nuts. ? Vegetable oils, especially olive oil. ? Fish that have healthy fats (omega-3 fatty acids), such as mackerel or salmon. The items listed above may not be a complete list of recommended foods and beverages. Contact a dietitian for more information. Lifestyle  Lose weight if you are overweight. Losing 5-10 lb (2.3-4.5 kg) can help prevent or control high cholesterol. It can also lower your risk for diabetes and high blood pressure. Ask your health care provider to help you with a diet and exercise plan to lose weight safely.  Do not use any products that contain nicotine or tobacco, such as cigarettes, e-cigarettes, and chewing tobacco. If you need help quitting, ask your health care provider.  Limit your alcohol intake. ? Do not drink alcohol if:  Your health care provider tells you not to drink.  You are pregnant, may be pregnant, or are planning to become pregnant. ? If you drink alcohol:  Limit how much you use to:  0-1 drink a day for women.  0-2 drinks a day for men.  Be aware of how much alcohol is in your drink. In the [place] one drink equals one 12 oz bottle of beer (355 mL), one 5 oz glass of wine (148 mL), or one 1 oz glass of hard liquor (44 mL). Activity   Get enough exercise. Each week, do at  least 150 minutes of exercise that takes a medium level of effort (moderate-intensity exercise). ? This is exercise that:  Makes your heart beat faster and makes you breathe harder than usual.  Allows you to still be able to talk. ? You could exercise in short sessions several times a day or longer sessions a few times a week. For example, on 5 days each week, you could walk fast or ride your bike 3 times a day for 10 minutes each time.  Do exercises as told  by your health care provider. Medicines  In addition to diet and lifestyle changes, your health care provider may recommend medicines to help lower cholesterol. This may be a medicine to lower the amount of cholesterol your liver makes. You may need medicine if: ? Diet and lifestyle changes do not lower your cholesterol enough. ? You have high cholesterol and other risk factors for heart disease or stroke.  Take over-the-counter and prescription medicines only as told by your health care provider. General information  Manage your risk factors for high cholesterol. Talk with your health care provider about all your risk factors and how to lower your risk.  Manage other conditions that you have, such as diabetes or high blood pressure (hypertension).  Have blood tests to check your cholesterol levels at regular points in time as told by your health care provider.  Keep all follow-up visits as told by your health care provider. This is important. Where to find more information  American Heart Association: www.heart.org  National Heart, Lung, and Blood Institute: https://wilson-eaton.com/ Summary  High cholesterol increases your risk for heart disease and stroke. By keeping your cholesterol level low, you can reduce your risk for these conditions.  High cholesterol can often be prevented with diet and lifestyle changes.  Work with your health care provider to manage your risk factors, and have your blood tested regularly. This information is not intended to replace advice given to you by your health care provider. Make sure you discuss any questions you have with your health care provider. Document Revised: 01/11/2019 Document Reviewed: 05/28/2016 Elsevier Patient Education  [place].  Dyslipidemia Dyslipidemia is an imbalance of waxy, fat-like substances (lipids) in the blood. The body needs lipids in small amounts. Dyslipidemia often involves a high level of cholesterol or  triglycerides, which are types of lipids. Common forms of dyslipidemia include:  High levels of LDL cholesterol. LDL is the type of cholesterol that causes fatty deposits (plaques) to build up in the blood vessels that carry blood away from your heart (arteries).  Low levels of HDL cholesterol. HDL cholesterol is the type of cholesterol that protects against heart disease. High levels of HDL remove the LDL buildup from arteries.  High levels of triglycerides. Triglycerides are a fatty substance in the blood that is linked to a buildup of plaques in the arteries. What are the causes? Primary dyslipidemia is caused by changes (mutations) in genes that are passed down through families (inherited). These mutations cause several types of dyslipidemia. Secondary dyslipidemia is caused by lifestyle choices and diseases that lead to dyslipidemia, such as:  Eating a diet that is high in animal fat.  Not getting enough exercise.  Having diabetes, kidney disease, liver disease, or thyroid disease.  Drinking large amounts of alcohol.  Using certain medicines. What increases the risk? You are more likely to develop this condition if you are an older man or if you are a woman who has gone through menopause. Other  risk factors include:  Having a family history of dyslipidemia.  Taking certain medicines, including birth control pills, steroids, some diuretics, and beta-blockers.  Smoking cigarettes.  Eating a high-fat diet.  Having certain medical conditions such as diabetes, polycystic ovary syndrome (PCOS), kidney disease, liver disease, or hypothyroidism.  Not exercising regularly.  Being overweight or obese with too much belly fat. What are the signs or symptoms? In most cases, dyslipidemia does not usually cause any symptoms. In severe cases, very high lipid levels can cause:  Fatty bumps under the skin (xanthomas).  White or gray ring around the black center (pupil) of the eye. Very  high triglyceride levels can cause inflammation of the pancreas (pancreatitis). How is this diagnosed? Your health care provider may diagnose dyslipidemia based on a routine blood test (fasting blood test). Because most people do not have symptoms of the condition, this blood testing (lipid profile) is done on adults age 61 and older and is repeated every 5 years. This test checks:  Total cholesterol. This measures the total amount of cholesterol in your blood, including LDL cholesterol, HDL cholesterol, and triglycerides. A healthy number is below 200.  LDL cholesterol. The target number for LDL cholesterol is different for each person, depending on individual risk factors. Ask your health care provider what your LDL cholesterol should be.  HDL cholesterol. An HDL level of 60 or higher is best because it helps to protect against heart disease. A number below 53 for men or below 23 for women increases the risk for heart disease.  Triglycerides. A healthy triglyceride number is below 150. If your lipid profile is abnormal, your health care provider may do other blood tests. How is this treated? Treatment depends on the type of dyslipidemia that you have and your other risk factors for heart disease and stroke. Your health care provider will have a target range for your lipid levels based on this information. For many people, this condition may be treated by lifestyle changes, such as diet and exercise. Your health care provider may recommend that you:  Get regular exercise.  Make changes to your diet.  Quit smoking if you smoke. If diet changes and exercise do not help you reach your goals, your health care provider may also prescribe medicine to lower lipids. The most commonly prescribed type of medicine lowers your LDL cholesterol (statin drug). If you have a high triglyceride level, your provider may prescribe another type of drug (fibrate) or an omega-3 fish oil supplement, or both. Follow  these instructions at home:  Eating and drinking  Follow instructions from your health care provider or dietitian about eating or drinking restrictions.  Eat a healthy diet as told by your health care provider. This can help you reach and maintain a healthy weight, lower your LDL cholesterol, and raise your HDL cholesterol. This may include: ? Limiting your calories, if you are overweight. ? Eating more fruits, vegetables, whole grains, fish, and lean meats. ? Limiting saturated fat, trans fat, and cholesterol.  If you drink alcohol: ? Limit how much you use. ? Be aware of how much alcohol is in your drink. In the [place] one drink equals one 12 oz bottle of beer (355 mL), one 5 oz glass of wine (148 mL), or one 1 oz glass of hard liquor (44 mL).  Do not drink alcohol if: ? Your health care provider tells you not to drink. ? You are pregnant, may be pregnant, or are planning to become pregnant. Activity  Get regular exercise. Start an exercise and strength training program as told by your health care provider. Ask your health care provider what activities are safe for you. Your health care provider may recommend: ? 30 minutes of aerobic activity 4-6 days a week. Brisk walking is an example of aerobic activity. ? Strength training 2 days a week. General instructions  Do not use any products that contain nicotine or tobacco, such as cigarettes, e-cigarettes, and chewing tobacco. If you need help quitting, ask your health care provider.  Take over-the-counter and prescription medicines only as told by your health care provider. This includes supplements.  Keep all follow-up visits as told by your health care provider. Contact a health care provider if:  You are: ? Having trouble sticking to your exercise or diet plan. ? Struggling to quit smoking or control your use of alcohol. Summary  Dyslipidemia often involves a high level of cholesterol or triglycerides, which are types of  lipids.  Treatment depends on the type of dyslipidemia that you have and your other risk factors for heart disease and stroke.  For many people, treatment starts with lifestyle changes, such as diet and exercise.  Your health care provider may prescribe medicine to lower lipids. This information is not intended to replace advice given to you by your health care provider. Make sure you discuss any questions you have with your health care provider. Document Revised: 05/14/2018 Document Reviewed: 04/20/2018 Elsevier Patient Education  [place].

## 2020-06-25 ENCOUNTER — Other Ambulatory Visit (INDEPENDENT_AMBULATORY_CARE_PROVIDER_SITE_OTHER): Payer: 59

## 2020-06-25 DIAGNOSIS — R7989 Other specified abnormal findings of blood chemistry: Secondary | ICD-10-CM | POA: Diagnosis not present

## 2020-06-26 ENCOUNTER — Encounter: Payer: Self-pay | Admitting: Internal Medicine

## 2020-06-26 ENCOUNTER — Ambulatory Visit: Payer: 59 | Admitting: Internal Medicine

## 2020-06-26 ENCOUNTER — Other Ambulatory Visit: Payer: Self-pay

## 2020-06-26 VITALS — BP 144/82 | HR 96 | Ht 67.0 in | Wt 118.8 lb

## 2020-06-26 DIAGNOSIS — I10 Essential (primary) hypertension: Secondary | ICD-10-CM

## 2020-06-26 DIAGNOSIS — E7849 Other hyperlipidemia: Secondary | ICD-10-CM | POA: Diagnosis not present

## 2020-06-26 DIAGNOSIS — R7401 Elevation of levels of liver transaminase levels: Secondary | ICD-10-CM

## 2020-06-26 DIAGNOSIS — I252 Old myocardial infarction: Secondary | ICD-10-CM

## 2020-06-26 LAB — HEPATITIS B SURFACE ANTIGEN: Hepatitis B Surface Ag: NONREACTIVE

## 2020-06-26 NOTE — Progress Notes (Signed)
Cardiology Office Note:    Date:  06/26/2020   ID:  Todd Hanna, DOB 1986/05/04, MRN [id number]  PCP:  Libby Maw, MD  [facility] Cardiologist:  Werner Lean, MD  [facility] Electrophysiologist:  None   Referring MD: Libby Maw,*   CC: Prior heart attack at age 34 Consulted for the evaluation of CAD at the behest of Libby Maw, MD   History of Present Illness:    Todd Hanna is a 34 y.o. male with a hx of hypertension and history of myocardial infarction (around 2006; 2007- unable to see records that ) who presents to establish care.  Patient notes that he had a heart attack in 2019.  Didn't have insurance and didn't pursue testing.  Patient notes that he is feeling good presently.  Patient remember that he was thinking he was going to die.  At that time had visual problems and blurring, and couldn't speak.  Had bad chest pain.  Patient woke up; and said that his heart was beating 220 beats per minute. Was told that he had a heart attack.  Patient thinks he had a stress test done. Was only there two days.  No prior LHC.  This was associated with taking [place] caffeine pills  Patient feels generally well.  Patient notes that he has occasional chest pulling that least 1 s.  No associated with exercise.  Works in a [employer] which is labor intensive and doesn't have issues  It getting work up for transaminitis.  No SOB, DOE, PND, Orthopnea, no syncope.    Past Medical History:  Diagnosis Date   Anxiety    Epididymitis, right 03/25/2015   History of stomach ulcers    Hypertension    Myocardial infarction [facility])    Nephrolithiasis    Pneumonia ~ 2011   Ulcerative colitis 2011   colonoscopy at [facility]   Past Surgical History:  Procedure Laterality Date   COLONOSCOPY  2011   dx'd ulcerative colitis   LITHOTRIPSY  2011   Current Medications: Current Meds  Medication Sig   cetirizine (ZYRTEC ALLERGY) 10 MG  tablet Take 1 tablet (10 mg total) by mouth 2 (two) times daily. As needed for hives.   cloNIDine (CATAPRES - DOSED IN MG/24 HR) 0.1 mg/24hr patch Place 1 patch (0.1 mg total) onto the skin once a week.     Allergies:   Amoxicillin and Penicillins   Social History   Socioeconomic History   Marital status: Single    Spouse name: Not on file   Number of children: 1   Years of education: Not on file   Highest education level: Not on file  Occupational History   Occupation: Special educational needs teacher: [employer]  Tobacco Use   Smoking status: Current Every Day Smoker    Packs/day: 0.60    Years: 13.00    Pack years: 7.80    Types: Cigarettes   Smokeless tobacco: Never Used  Vaping Use   Vaping Use: Never used  Substance and Sexual Activity   Alcohol use: Yes    Alcohol/week: 4.0 standard drinks    Types: 4 Cans of beer per week   Drug use: No   Sexual activity: Yes  Other Topics Concern   Not on file  Social History Narrative   Not on file   Social Determinants of Health   Financial Resource Strain:    Difficulty of Paying Living Expenses: Not on file  Food Insecurity:  Worried About Charity fundraiser in the Last Year: Not on file   [employer] of Food in the Last Year: Not on file  Transportation Needs:    Lack of Transportation (Medical): Not on file   Lack of Transportation (Non-Medical): Not on file  Physical Activity:    Days of Exercise per Week: Not on file   Minutes of Exercise per Session: Not on file  Stress:    Feeling of Stress : Not on file  Social Connections:    Frequency of Communication with Friends and Family: Not on file   Frequency of Social Gatherings with Friends and Family: Not on file   Attends Religious Services: Not on file   Active Member of Clubs or Organizations: Not on file   Attends Archivist Meetings: Not on file   Marital Status: Not on file   Family History: The patient's family history  includes Hypertension in his mother. M grandfather MI in late 51s.  ROS:   Please see the history of present illness.    All other systems reviewed and are negative.  EKGs/Labs/Other Studies Reviewed:    The following studies were reviewed today:  EKG:  EKG is  ordered today.  The ekg ordered today demonstrates sinus rhythm, rate 96, no ST/T changes; no q waves 06/28/2015: sinus tach rate 105 without q waves  Recent Labs: 06/16/2020: ALT 67; BUN 9; Creatinine, Ser 0.78; Hemoglobin 15.2; Platelets 344.0; Potassium 4.2; Sodium 136; TSH 2.51  Recent Lipid Panel    Component Value Date/Time   CHOL 220 (H) 06/16/2020 1119   TRIG 328.0 (H) 06/16/2020 1119   HDL 102.90 06/16/2020 1119   CHOLHDL 2 06/16/2020 1119   VLDL 65.6 (H) 06/16/2020 1119   LDLDIRECT 55.0 06/16/2020 1119   05/07/2010 [facility] Chest- no coronary calcification  Physical Exam:    VS:  BP (!) 144/82    Pulse 96    Ht 5' 7"  (1.702 m)    Wt 118 lb 12.8 oz (53.9 kg)    SpO2 98%    BMI 18.61 kg/m     Wt Readings from Last 3 Encounters:  06/26/20 118 lb 12.8 oz (53.9 kg)  06/23/20 118 lb (53.5 kg)  06/16/20 118 lb 6.4 oz (53.7 kg)     GEN: Well nourished, well developed in no acute distress; smells of smoke HEENT: Normal NECK: No JVD; No carotid bruits LYMPHATICS: No lymphadenopathy CARDIAC: RRR, no murmurs, rubs, gallops RESPIRATORY:  Clear to auscultation without rales, wheezing or rhonchi  ABDOMEN: Soft, non-tender, non-distended MUSCULOSKELETAL:  No edema; No deformity  SKIN: Warm and dry NEUROLOGIC:  Alert and oriented x 3 PSYCHIATRIC:  Normal affect   ASSESSMENT:    1. History of myocardial infarction   2. Essential hypertension   3. Other hyperlipidemia   4. Transaminitis    PLAN:    In order of problems listed above:  1. History of myocardial infarction and coronary artery disease with history of substance abuse (THC) and HTN - will start ambulatory blood pressure monitoring - clonidine patch  reasonable- will continue - with transaminitis and ? Of MI; will defer statin at this time - will get echocardiogram to assess prior MI with evidence of damage - unclear etiology of MI; will get echo to rule out persistent WMA/scar - discussed substance abuse   Tobacco Abuse 1. The patient was counseled on the dangers of tobacco use, both inhaled and oral, which include, but are not limited to cardiovascular disease,  increased cancer risk of multiple types of cancer, COPD, peripheral vascular disease, strokes. 2. He was also counseled on the benefits of smoking cessation. 3. The patient was firmly advised to quit.    4. We also reviewed strategies to maximize success, including:  Removing cigarettes and smoking materials from environment  Stress management  Substitution of other forms of reinforcement Support of family/friends.  Selecting a quit date.  Patient provided contact information for 1-800-QUIT-NOW    Medication Adjustments/Labs and Tests Ordered: Current medicines are reviewed at length with the patient today.  Concerns regarding medicines are outlined above.  Orders Placed This Encounter  Procedures   EKG 12-Lead   ECHOCARDIOGRAM COMPLETE   No orders of the defined types were placed in this encounter.   Patient Instructions  Medication Instructions:  NO CHANGES *If you need a refill on your cardiac medications before your next appointment, please call your pharmacy*   Lab Work: NONE If you have labs (blood work) drawn today and your tests are completely normal, you will receive your results only by:  [place] (if you have MyChart) OR  A paper copy in the mail If you have any lab test that is abnormal or we need to change your treatment, we will call you to review the results.   Testing/Procedures: Your physician has requested that you have an echocardiogram. Echocardiography is a painless test that uses sound waves to create images of your heart.  It provides your doctor with information about the size and shape of your heart and how well your hearts chambers and valves are working. This procedure takes approximately one hour. There are no restrictions for this procedure.     Follow-Up: At [facility], you and your health needs are our priority.  As part of our continuing mission to provide you with exceptional heart care, we have created designated Provider Care Teams.  These Care Teams include your primary Cardiologist (physician) and Advanced Practice Providers (APPs -  Physician Assistants and Nurse Practitioners) who all work together to provide you with the care you need, when you need it.  We recommend signing up for the patient portal called "MyChart".  Sign up information is provided on this After Visit Summary.  MyChart is used to connect with patients for Virtual Visits (Telemedicine).  Patients are able to view lab/test results, encounter notes, upcoming appointments, etc.  Non-urgent messages can be sent to your provider as well.   To learn more about what you can do with MyChart, go to [email or web address].    Your next appointment:   1 year(s)  The format for your next appointment:   In Person  Provider:   Rudean Haskell, MD   Other Instructions CHECK B/P AT HOME      Signed, Werner Lean, MD  06/26/2020 9:53 AM    [place]

## 2020-06-26 NOTE — Patient Instructions (Signed)
Medication Instructions:  NO CHANGES *If you need a refill on your cardiac medications before your next appointment, please call your pharmacy*   Lab Work: NONE If you have labs (blood work) drawn today and your tests are completely normal, you will receive your results only by:  [place] (if you have MyChart) OR  A paper copy in the mail If you have any lab test that is abnormal or we need to change your treatment, we will call you to review the results.   Testing/Procedures: Your physician has requested that you have an echocardiogram. Echocardiography is a painless test that uses sound waves to create images of your heart. It provides your doctor with information about the size and shape of your heart and how well your hearts chambers and valves are working. This procedure takes approximately one hour. There are no restrictions for this procedure.     Follow-Up: At [facility], you and your health needs are our priority.  As part of our continuing mission to provide you with exceptional heart care, we have created designated Provider Care Teams.  These Care Teams include your primary Cardiologist (physician) and Advanced Practice Providers (APPs -  Physician Assistants and Nurse Practitioners) who all work together to provide you with the care you need, when you need it.  We recommend signing up for the patient portal called "MyChart".  Sign up information is provided on this After Visit Summary.  MyChart is used to connect with patients for Virtual Visits (Telemedicine).  Patients are able to view lab/test results, encounter notes, upcoming appointments, etc.  Non-urgent messages can be sent to your provider as well.   To learn more about what you can do with MyChart, go to [email or web address].    Your next appointment:   1 year(s)  The format for your next appointment:   In Person  Provider:   Rudean Haskell, MD   Other Instructions CHECK B/P AT HOME

## 2020-06-29 ENCOUNTER — Other Ambulatory Visit: Payer: Self-pay

## 2020-06-29 ENCOUNTER — Ambulatory Visit (HOSPITAL_BASED_OUTPATIENT_CLINIC_OR_DEPARTMENT_OTHER)
Admission: RE | Admit: 2020-06-29 | Discharge: 2020-06-29 | Disposition: A | Discharge status: Home / Self Care | Payer: 59 | Source: Ambulatory Visit | Attending: Family Medicine | Admitting: Family Medicine

## 2020-06-29 DIAGNOSIS — R7989 Other specified abnormal findings of blood chemistry: Secondary | ICD-10-CM | POA: Insufficient documentation

## 2020-07-14 ENCOUNTER — Ambulatory Visit (HOSPITAL_COMMUNITY): Payer: 59 | Attending: Cardiology

## 2020-07-14 ENCOUNTER — Other Ambulatory Visit: Payer: Self-pay

## 2020-07-14 DIAGNOSIS — I252 Old myocardial infarction: Secondary | ICD-10-CM | POA: Insufficient documentation

## 2020-07-14 LAB — ECHOCARDIOGRAM COMPLETE
Area-P 1/2: 4.29 cm2
S' Lateral: 2.2 cm

## 2020-10-06 ENCOUNTER — Other Ambulatory Visit: Payer: Self-pay

## 2020-10-07 ENCOUNTER — Encounter: Payer: Self-pay | Admitting: Nurse Practitioner

## 2020-10-07 ENCOUNTER — Ambulatory Visit (INDEPENDENT_AMBULATORY_CARE_PROVIDER_SITE_OTHER): Payer: 59 | Admitting: Nurse Practitioner

## 2020-10-07 VITALS — BP 160/100 | HR 100 | Temp 98.1°F | Ht 67.5 in | Wt 118.0 lb

## 2020-10-07 DIAGNOSIS — I10 Essential (primary) hypertension: Secondary | ICD-10-CM

## 2020-10-07 LAB — BASIC METABOLIC PANEL
BUN: 10 mg/dL (ref 6–23)
CO2: 28 mEq/L (ref 19–32)
Calcium: 10.4 mg/dL (ref 8.4–10.5)
Chloride: 97 mEq/L (ref 96–112)
Creatinine, Ser: 0.72 mg/dL (ref 0.40–1.50)
GFR: 118.77 mL/min (ref >60.00)
Glucose, Bld: 88 mg/dL (ref 70–99)
Potassium: 4 mEq/L (ref 3.5–5.1)
Sodium: 135 mEq/L (ref 135–145)

## 2020-10-07 MED ORDER — VALSARTAN 80 MG PO TABS: 80.0000 mg | ORAL_TABLET | Freq: Every day | ORAL | 5 refills | Status: DC

## 2020-10-07 NOTE — Patient Instructions (Addendum)
Go to lab for blood draw and urine collection. Maintain current clonidine  Patch as prescribed Monitor BP once a day in AM Call office if BP >180/100 Maintain DASH diet.   DASH Eating Plan DASH stands for "Dietary Approaches to Stop Hypertension." The DASH eating plan is a healthy eating plan that has been shown to reduce high blood pressure (hypertension). It may also reduce your risk for type 2 diabetes, heart disease, and stroke. The DASH eating plan may also help with weight loss. What are tips for following this plan?  General guidelines  Avoid eating more than 2,300 mg (milligrams) of salt (sodium) a day. If you have hypertension, you may need to reduce your sodium intake to 1,500 mg a day.  Limit alcohol intake to no more than 1 drink a day for nonpregnant women and 2 drinks a day for men. One drink equals 12 oz of beer, 5 oz of wine, or 1 oz of hard liquor.  Work with your health care provider to maintain a healthy body weight or to lose weight. Ask what an ideal weight is for you.  Get at least 30 minutes of exercise that causes your heart to beat faster (aerobic exercise) most days of the week. Activities may include walking, swimming, or biking.  Work with your health care provider or diet and nutrition specialist (dietitian) to adjust your eating plan to your individual calorie needs. Reading food labels   Check food labels for the amount of sodium per serving. Choose foods with less than 5 percent of the Daily Value of sodium. Generally, foods with less than 300 mg of sodium per serving fit into this eating plan.  To find whole grains, look for the word "whole" as the first word in the ingredient list. Shopping  Buy products labeled as "low-sodium" or "no salt added."  Buy fresh foods. Avoid canned foods and premade or frozen meals. Cooking  Avoid adding salt when cooking. Use salt-free seasonings or herbs instead of table salt or sea salt. Check with your health care  provider or pharmacist before using salt substitutes.  Do not fry foods. Cook foods using healthy methods such as baking, boiling, grilling, and broiling instead.  Cook with heart-healthy oils, such as olive, canola, soybean, or sunflower oil. Meal planning  Eat a balanced diet that includes: ? 5 or more servings of fruits and vegetables each day. At each meal, try to fill half of your plate with fruits and vegetables. ? Up to 6-8 servings of whole grains each day. ? Less than 6 oz of lean meat, poultry, or fish each day. A 3-oz serving of meat is about the same size as a deck of cards. One egg equals 1 oz. ? 2 servings of low-fat dairy each day. ? A serving of nuts, seeds, or beans 5 times each week. ? Heart-healthy fats. Healthy fats called Omega-3 fatty acids are found in foods such as flaxseeds and coldwater fish, like sardines, salmon, and mackerel.  Limit how much you eat of the following: ? Canned or prepackaged foods. ? Food that is high in trans fat, such as fried foods. ? Food that is high in saturated fat, such as fatty meat. ? Sweets, desserts, sugary drinks, and other foods with added sugar. ? Full-fat dairy products.  Do not salt foods before eating.  Try to eat at least 2 vegetarian meals each week.  Eat more home-cooked food and less restaurant, buffet, and fast food.  When eating at a  restaurant, ask that your food be prepared with less salt or no salt, if possible. What foods are recommended? The items listed may not be a complete list. Talk with your dietitian about what dietary choices are best for you. Grains Whole-grain or whole-wheat bread. Whole-grain or whole-wheat pasta. Brown rice. [place]. Bulgur. Whole-grain and low-sodium cereals. Pita bread. Low-fat, low-sodium crackers. Whole-wheat flour tortillas. Vegetables Fresh or frozen vegetables (raw, steamed, roasted, or grilled). Low-sodium or reduced-sodium tomato and vegetable juice. Low-sodium or  reduced-sodium tomato sauce and tomato paste. Low-sodium or reduced-sodium canned vegetables. Fruits All fresh, dried, or frozen fruit. Canned fruit in natural juice (without added sugar). Meat and other protein foods Skinless chicken or [place]. Ground chicken or [place]. Pork with fat trimmed off. Fish and seafood. Egg whites. Dried beans, peas, or lentils. Unsalted nuts, nut butters, and seeds. Unsalted canned beans. Lean cuts of beef with fat trimmed off. Low-sodium, lean deli meat. Dairy Low-fat (1%) or fat-free (skim) milk. Fat-free, low-fat, or reduced-fat cheeses. Nonfat, low-sodium ricotta or cottage cheese. Low-fat or nonfat yogurt. Low-fat, low-sodium cheese. Fats and oils Soft margarine without trans fats. Vegetable oil. Low-fat, reduced-fat, or light mayonnaise and salad dressings (reduced-sodium). Canola, safflower, olive, soybean, and sunflower oils. Avocado. Seasoning and other foods Herbs. Spices. Seasoning mixes without salt. Unsalted popcorn and pretzels. Fat-free sweets. What foods are not recommended? The items listed may not be a complete list. Talk with your dietitian about what dietary choices are best for you. Grains Baked goods made with fat, such as croissants, muffins, or some breads. Dry pasta or rice meal packs. Vegetables Creamed or fried vegetables. Vegetables in a cheese sauce. Regular canned vegetables (not low-sodium or reduced-sodium). Regular canned tomato sauce and paste (not low-sodium or reduced-sodium). Regular tomato and vegetable juice (not low-sodium or reduced-sodium). Angie Fava. Olives. Fruits Canned fruit in a light or heavy syrup. Fried fruit. Fruit in cream or butter sauce. Meat and other protein foods Fatty cuts of meat. Ribs. Fried meat. Berniece Salines. Sausage. Bologna and other processed lunch meats. Salami. Fatback. Hotdogs. Bratwurst. Salted nuts and seeds. Canned beans with added salt. Canned or smoked fish. Whole eggs or egg yolks. Chicken or [place]  with skin. Dairy Whole or 2% milk, cream, and half-and-half. Whole or full-fat cream cheese. Whole-fat or sweetened yogurt. Full-fat cheese. Nondairy creamers. Whipped toppings. Processed cheese and cheese spreads. Fats and oils Butter. Stick margarine. Lard. Shortening. Ghee. Bacon fat. Tropical oils, such as coconut, palm kernel, or palm oil. Seasoning and other foods Salted popcorn and pretzels. Onion salt, garlic salt, seasoned salt, table salt, and sea salt. Worcestershire sauce. Tartar sauce. Barbecue sauce. Teriyaki sauce. Soy sauce, including reduced-sodium. Steak sauce. Canned and packaged gravies. Fish sauce. Oyster sauce. Cocktail sauce. Horseradish that you find on the shelf. Ketchup. Mustard. Meat flavorings and tenderizers. Bouillon cubes. Hot sauce and Tabasco sauce. Premade or packaged marinades. Premade or packaged taco seasonings. Relishes. Regular salad dressings. Where to find more information:  National Heart, Lung, and [place]: https://wilson-eaton.com/  American Heart Association: www.heart.org Summary  The DASH eating plan is a healthy eating plan that has been shown to reduce high blood pressure (hypertension). It may also reduce your risk for type 2 diabetes, heart disease, and stroke.  With the DASH eating plan, you should limit salt (sodium) intake to 2,300 mg a day. If you have hypertension, you may need to reduce your sodium intake to 1,500 mg a day.  When on the DASH eating plan, aim to eat more  fresh fruits and vegetables, whole grains, lean proteins, low-fat dairy, and heart-healthy fats.  Work with your health care provider or diet and nutrition specialist (dietitian) to adjust your eating plan to your individual calorie needs. This information is not intended to replace advice given to you by your health care provider. Make sure you discuss any questions you have with your health care provider. Document Revised: 09/01/2017 Document Reviewed:  09/12/2016 Elsevier Patient Education  2020 [employer].

## 2020-10-07 NOTE — Progress Notes (Signed)
Subjective:  Patient ID: Todd Hanna, male    DOB: [birth year]  Age: 35 y.o. MRN: [id number]  CC: Acute Visit (Pt c/o elevated BP x2 weeks. Pt though elevated BPs were due to pain but states he wore two of his BP patches today but is still showing high readings. )  HPI  Hypertension Elevated BP in last 2weeks: 190/110-180/100 Asymptomatic Reports compliance with clonidine patch Denies any use of OTC medication Daily tobacco use: 6cig per day ETOH intake on weekends: 3beers per day x 2days  Repeat BMP and urine protein Start valsartan Continue clonidine patch F/up in 2weeks  advised about need for DASH diet and tobacco use cessation. Monitor BP daily in AM   Reviewed past Medical, Social and Family history today.  Outpatient Medications Prior to Visit  Medication Sig Dispense Refill  . cetirizine (ZYRTEC ALLERGY) 10 MG tablet Take 1 tablet (10 mg total) by mouth 2 (two) times daily. As needed for hives. 60 tablet 0  . cloNIDine (CATAPRES - DOSED IN MG/24 HR) 0.1 mg/24hr patch Place 1 patch (0.1 mg total) onto the skin once a week. 12 patch 3   No facility-administered medications prior to visit.    ROS See HPI  Objective:  BP (!) 160/100 (BP Location: Left Arm, Cuff Size: Normal)   Pulse 100   Temp 98.1 F (36.7 C) (Temporal)   Ht 5' 7.5" (1.715 m)   Wt 118 lb (53.5 kg)   SpO2 98%   BMI 18.21 kg/m   Physical Exam Vitals reviewed.  Constitutional:      General: He is not in acute distress. Cardiovascular:     Rate and Rhythm: Normal rate and regular rhythm.     Pulses: Normal pulses.     Heart sounds: Normal heart sounds.  Pulmonary:     Effort: Pulmonary effort is normal.     Breath sounds: Normal breath sounds.  Musculoskeletal:     Right lower leg: No edema.     Left lower leg: No edema.  Neurological:     Mental Status: He is alert and oriented to person, place, and time.  Psychiatric:        Mood and Affect: Mood normal.        Behavior:  Behavior normal.     Assessment & Plan:  This visit occurred during the SARS-CoV-2 public health emergency.  Safety protocols were in place, including screening questions prior to the visit, additional usage of staff PPE, and extensive cleaning of exam room while observing appropriate contact time as indicated for disinfecting solutions.   [place] was seen today for acute visit.  Diagnoses and all orders for this visit:  Essential hypertension -     Basic metabolic panel -     Protein / creatinine ratio, urine -     valsartan (DIOVAN) 80 MG tablet; Take 1 tablet (80 mg total) by mouth daily.    Problem List Items Addressed This Visit      Cardiovascular and Mediastinum   Hypertension - Primary    Elevated BP in last 2weeks: 190/110-180/100 Asymptomatic Reports compliance with clonidine patch Denies any use of OTC medication Daily tobacco use: 6cig per day ETOH intake on weekends: 3beers per day x 2days  Repeat BMP and urine protein Start valsartan Continue clonidine patch F/up in 2weeks  advised about need for DASH diet and tobacco use cessation. Monitor BP daily in AM      Relevant Medications   valsartan (DIOVAN) 80 MG  tablet      Follow-up: Return in about 2 weeks (around 10/21/2020) for HTN and tobacco cessation.  Wilfred Lacy, NP

## 2020-10-07 NOTE — Assessment & Plan Note (Addendum)
Elevated BP in last 2weeks: 190/110-180/100 Asymptomatic Reports compliance with clonidine patch Denies any use of OTC medication Daily tobacco use: 6cig per day ETOH intake on weekends: 3beers per day x 2days BP Readings from Last 3 Encounters:  10/07/20 (!) 160/100  06/26/20 (!) 144/82  06/16/20 134/82   Repeat BMP and urine protein Start valsartan Continue clonidine patch F/up in 2weeks  advised about need for DASH diet and tobacco use cessation. Monitor BP daily in AM

## 2020-10-08 ENCOUNTER — Encounter: Payer: Self-pay | Admitting: Nurse Practitioner

## 2020-10-08 LAB — PROTEIN / CREATININE RATIO, URINE
Creatinine, Urine: 102 mg/dL (ref 20–320)
Protein/Creat Ratio: 559 mg/g creat — ABNORMAL HIGH (ref 22–128)
Protein/Creatinine Ratio: 0.559 mg/mg creat — ABNORMAL HIGH (ref 0.022–0.12)
Total Protein, Urine: 57 mg/dL — ABNORMAL HIGH (ref 5–25)

## 2020-10-09 NOTE — Addendum Note (Signed)
Addended by: Leana Gamer on: 10/09/2020 04:37 PM   Modules accepted: Orders

## 2020-10-13 ENCOUNTER — Other Ambulatory Visit: Payer: Self-pay

## 2020-10-13 ENCOUNTER — Ambulatory Visit (HOSPITAL_COMMUNITY)
Admission: RE | Admit: 2020-10-13 | Discharge: 2020-10-13 | Disposition: A | Discharge status: Home / Self Care | Payer: 59 | Source: Ambulatory Visit | Attending: Nurse Practitioner | Admitting: Nurse Practitioner

## 2020-10-13 DIAGNOSIS — I10 Essential (primary) hypertension: Secondary | ICD-10-CM | POA: Diagnosis present

## 2020-12-15 ENCOUNTER — Ambulatory Visit: Payer: 59 | Admitting: Family Medicine

## 2021-02-01 ENCOUNTER — Other Ambulatory Visit: Payer: Self-pay | Admitting: Family Medicine

## 2021-02-01 DIAGNOSIS — L509 Urticaria, unspecified: Secondary | ICD-10-CM

## 2021-02-01 NOTE — Telephone Encounter (Signed)
lft VM to rtn call. [facility]

## 2021-02-03 ENCOUNTER — Ambulatory Visit: Payer: 59 | Admitting: Family Medicine

## 2021-04-13 ENCOUNTER — Other Ambulatory Visit: Payer: Self-pay | Admitting: Nurse Practitioner

## 2021-04-13 DIAGNOSIS — I10 Essential (primary) hypertension: Secondary | ICD-10-CM

## 2021-04-14 NOTE — Telephone Encounter (Signed)
Chart supports Rx Last OV 10/07/2020 No future appointments scheduled.

## 2021-05-17 ENCOUNTER — Other Ambulatory Visit: Payer: Self-pay | Admitting: Family Medicine

## 2021-05-17 DIAGNOSIS — I10 Essential (primary) hypertension: Secondary | ICD-10-CM

## 2021-05-18 ENCOUNTER — Encounter (HOSPITAL_COMMUNITY): Payer: Self-pay | Admitting: Emergency Medicine

## 2021-05-18 ENCOUNTER — Inpatient Hospital Stay (HOSPITAL_COMMUNITY)
Admission: EM | Admit: 2021-05-18 | Discharge: 2021-05-19 | DRG: 179 | Discharge status: Against Medical Advice | Payer: 59 | Attending: Internal Medicine | Admitting: Internal Medicine

## 2021-05-18 ENCOUNTER — Emergency Department (HOSPITAL_COMMUNITY): Payer: 59

## 2021-05-18 DIAGNOSIS — I10 Essential (primary) hypertension: Secondary | ICD-10-CM | POA: Diagnosis present

## 2021-05-18 DIAGNOSIS — F4310 Post-traumatic stress disorder, unspecified: Secondary | ICD-10-CM | POA: Diagnosis present

## 2021-05-18 DIAGNOSIS — F419 Anxiety disorder, unspecified: Secondary | ICD-10-CM | POA: Diagnosis present

## 2021-05-18 DIAGNOSIS — T507X1A Poisoning by analeptics and opioid receptor antagonists, accidental (unintentional), initial encounter: Secondary | ICD-10-CM | POA: Diagnosis present

## 2021-05-18 DIAGNOSIS — F1010 Alcohol abuse, uncomplicated: Secondary | ICD-10-CM | POA: Diagnosis present

## 2021-05-18 DIAGNOSIS — T40601A Poisoning by unspecified narcotics, accidental (unintentional), initial encounter: Secondary | ICD-10-CM

## 2021-05-18 DIAGNOSIS — I2510 Atherosclerotic heart disease of native coronary artery without angina pectoris: Secondary | ICD-10-CM | POA: Diagnosis present

## 2021-05-18 DIAGNOSIS — Z5329 Procedure and treatment not carried out because of patient's decision for other reasons: Secondary | ICD-10-CM | POA: Diagnosis not present

## 2021-05-18 DIAGNOSIS — G894 Chronic pain syndrome: Secondary | ICD-10-CM | POA: Diagnosis present

## 2021-05-18 DIAGNOSIS — F17210 Nicotine dependence, cigarettes, uncomplicated: Secondary | ICD-10-CM | POA: Diagnosis present

## 2021-05-18 DIAGNOSIS — J690 Pneumonitis due to inhalation of food and vomit: Principal | ICD-10-CM | POA: Diagnosis present

## 2021-05-18 DIAGNOSIS — I252 Old myocardial infarction: Secondary | ICD-10-CM

## 2021-05-18 DIAGNOSIS — Z79899 Other long term (current) drug therapy: Secondary | ICD-10-CM | POA: Diagnosis not present

## 2021-05-18 DIAGNOSIS — Z880 Allergy status to penicillin: Secondary | ICD-10-CM | POA: Diagnosis not present

## 2021-05-18 DIAGNOSIS — K5190 Ulcerative colitis, unspecified, without complications: Secondary | ICD-10-CM | POA: Diagnosis present

## 2021-05-18 DIAGNOSIS — Z720 Tobacco use: Secondary | ICD-10-CM | POA: Diagnosis not present

## 2021-05-18 DIAGNOSIS — R0902 Hypoxemia: Secondary | ICD-10-CM | POA: Diagnosis present

## 2021-05-18 DIAGNOSIS — Z20822 Contact with and (suspected) exposure to covid-19: Secondary | ICD-10-CM | POA: Diagnosis present

## 2021-05-18 DIAGNOSIS — Z87442 Personal history of urinary calculi: Secondary | ICD-10-CM

## 2021-05-18 DIAGNOSIS — E876 Hypokalemia: Secondary | ICD-10-CM | POA: Diagnosis present

## 2021-05-18 DIAGNOSIS — E7849 Other hyperlipidemia: Secondary | ICD-10-CM | POA: Diagnosis present

## 2021-05-18 DIAGNOSIS — K219 Gastro-esophageal reflux disease without esophagitis: Secondary | ICD-10-CM | POA: Diagnosis present

## 2021-05-18 DIAGNOSIS — F1911 Other psychoactive substance abuse, in remission: Secondary | ICD-10-CM | POA: Diagnosis present

## 2021-05-18 DIAGNOSIS — Z8249 Family history of ischemic heart disease and other diseases of the circulatory system: Secondary | ICD-10-CM | POA: Diagnosis not present

## 2021-05-18 LAB — CBC WITH DIFFERENTIAL/PLATELET
Abs Immature Granulocytes: 0.08 10*3/uL — ABNORMAL HIGH (ref 0.00–0.07)
Basophils Absolute: 0.1 10*3/uL (ref 0.0–0.1)
Basophils Relative: 1 %
Eosinophils Absolute: 0.1 10*3/uL (ref 0.0–0.5)
Eosinophils Relative: 1 %
HCT: 42 % (ref 39.0–52.0)
Hemoglobin: 14.4 g/dL (ref 13.0–17.0)
Immature Granulocytes: 1 %
Lymphocytes Relative: 10 %
Lymphs Abs: 1.3 10*3/uL (ref 0.7–4.0)
MCH: 34.5 pg — ABNORMAL HIGH (ref 26.0–34.0)
MCHC: 34.3 g/dL (ref 30.0–36.0)
MCV: 100.7 fL — ABNORMAL HIGH (ref 80.0–100.0)
Monocytes Absolute: 0.5 10*3/uL (ref 0.1–1.0)
Monocytes Relative: 4 %
Neutro Abs: 11.3 10*3/uL — ABNORMAL HIGH (ref 1.7–7.7)
Neutrophils Relative %: 83 %
Platelets: 274 10*3/uL (ref 150–400)
RBC: 4.17 MIL/uL — ABNORMAL LOW (ref 4.22–5.81)
RDW: 12.1 % (ref 11.5–15.5)
WBC: 13.3 10*3/uL — ABNORMAL HIGH (ref 4.0–10.5)
nRBC: 0 % (ref 0.0–0.2)

## 2021-05-18 LAB — COMPREHENSIVE METABOLIC PANEL
ALT: 233 U/L — ABNORMAL HIGH (ref 0–44)
AST: 316 U/L — ABNORMAL HIGH (ref 15–41)
Albumin: 4.6 g/dL (ref 3.5–5.0)
Alkaline Phosphatase: 101 U/L (ref 38–126)
Anion gap: 16 — ABNORMAL HIGH (ref 5–15)
BUN: 11 mg/dL (ref 6–20)
CO2: 25 mmol/L (ref 22–32)
Calcium: 9.8 mg/dL (ref 8.9–10.3)
Chloride: 100 mmol/L (ref 98–111)
Creatinine, Ser: 1.13 mg/dL (ref 0.61–1.24)
GFR, Estimated: >60 mL/min (ref >60)
Glucose, Bld: 127 mg/dL — ABNORMAL HIGH (ref 70–99)
Potassium: 3.2 mmol/L — ABNORMAL LOW (ref 3.5–5.1)
Sodium: 141 mmol/L (ref 135–145)
Total Bilirubin: 0.5 mg/dL (ref 0.3–1.2)
Total Protein: 8.1 g/dL (ref 6.5–8.1)

## 2021-05-18 LAB — CBG MONITORING, ED: Glucose-Capillary: 127 mg/dL — ABNORMAL HIGH (ref 70–99)

## 2021-05-18 MED ORDER — LACTATED RINGERS IV BOLUS
1000.0000 mL | Freq: Once | INTRAVENOUS | Status: AC
  Administered 2021-05-18: 1000 mL via INTRAVENOUS

## 2021-05-18 MED ORDER — METRONIDAZOLE 500 MG/100ML IV SOLN
500.0000 mg | Freq: Once | INTRAVENOUS | Status: AC
  Administered 2021-05-18: 500 mg via INTRAVENOUS
  Filled 2021-05-18: qty 100

## 2021-05-18 MED ORDER — METRONIDAZOLE 500 MG/100ML IV SOLN: 500.0000 mg | Freq: Three times a day (TID) | INTRAVENOUS | Status: DC

## 2021-05-18 MED ORDER — METRONIDAZOLE 500 MG/100ML IV SOLN: 500.0000 mg | Freq: Two times a day (BID) | INTRAVENOUS | Status: DC

## 2021-05-18 MED ORDER — SODIUM CHLORIDE 0.9 % IV SOLN
1.0000 g | INTRAVENOUS | Status: DC
  Administered 2021-05-18: 1 g via INTRAVENOUS
  Filled 2021-05-18: qty 10

## 2021-05-18 MED ORDER — LACTATED RINGERS IV SOLN: INTRAVENOUS | Status: DC

## 2021-05-18 NOTE — ED Notes (Signed)
Pt o2 87% on RA. Pt placed on 3L [place]. O2 now 98%.

## 2021-05-18 NOTE — H&P (Addendum)
History and Physical   [name] QQP:[id number] DOB: 09/23/1986 DOA: 05/18/2021  Referring MD/NP/PA: Dr. Zenia Resides  PCP: Libby Maw, MD   Outpatient Specialists: None  Patient coming from: Found in a car by a bystander  Chief Complaint: Altered mental status and hypoxia  HPI: Todd Hanna is a 35 y.o. male with medical history significant of essential hypertension, chronic pain syndrome, history of ulcerative colitis, alcohol abuse, anxiety disorder, prior history of coronary artery disease and GERD who was apparently found unresponsive in his car today.  Patient apparently uses Suboxone.  There is also reported drinking liquor.  He was found by bystander apparently his car unknown period of time he was passed out.  When EMS arrived he was having agonal respirations.  His oxygen saturation was in the 20s and was given Narcan as well as nonrebreather back.  His blood glucose was 145.  He denied any substance abuse he denied any other medications.  Suspected to have been drug overdose.  Patient's oxygen sat remained low in the 80s in the ER and initial evaluation suggested possible aspiration pneumonitis when he passed out.  Not sure if he had a seizure or just passed out.  Wife is at bedside now.  Patient is denying drug abuse.  We admitted the patient for observation..  ED Course: Temperature is 98.3 blood pressure 107/76, pulse 143,, respiratory rate was 24 and oxygen sats currently 92% on 2 L.  Sodium is 141 potassium 3.2 chloride 100 CO2 25 glucose 127 BUN 11 creatinine 1.13 calcium 9.8 gap of 16.  White count 13.3 hemoglobin 14.4 and platelet count of 274.  Chest x-ray showed no acute findings.  Urine drug screen currently pending.  Patient is being admitted with possible aspiration pneumonitis.  Review of Systems: As per HPI otherwise 10 point review of systems negative.    Past Medical History:  Diagnosis Date   Anxiety    Epididymitis, right 03/25/2015   History of stomach  ulcers    Hypertension    Myocardial infarction [facility])    Nephrolithiasis    Pneumonia ~ 2011   Ulcerative colitis 2011   colonoscopy at [facility]    Past Surgical History:  Procedure Laterality Date   COLONOSCOPY  2011   dx'd ulcerative colitis   LITHOTRIPSY  2011     reports that he has been smoking cigarettes. He has a 7.80 pack-year smoking history. He has never used smokeless tobacco. He reports current alcohol use of about 4.0 standard drinks per week. He reports that he does not use drugs.  Allergies  Allergen Reactions   Amoxicillin Hives   Penicillins Hives    Family History  Problem Relation Age of Onset   Hypertension Mother      Prior to Admission medications   Medication Sig Start Date End Date Taking? Authorizing Provider  acetaminophen (TYLENOL) 500 MG tablet Take 500-1,000 mg by mouth every 6 (six) hours as needed for mild pain or headache.   Yes [provider]  omeprazole (PRILOSEC OTC) 20 MG tablet Take 20 mg by mouth daily as needed (for heartburn).   Yes [provider]  valsartan (DIOVAN) 80 MG tablet Take 1 tablet by mouth once daily Patient taking differently: Take 80 mg by mouth daily. 04/14/21  Yes Libby Maw, MD  cetirizine (ZYRTEC ALLERGY) 10 MG tablet Take 1 tablet (10 mg total) by mouth 2 (two) times daily. As needed for hives. Patient not taking: Reported on 05/18/2021 06/16/20  Libby Maw, MD  cloNIDine (CATAPRES - DOSED IN MG/24 HR) 0.1 mg/24hr patch Place 1 patch (0.1 mg total) onto the skin once a week. Patient not taking: Reported on 05/18/2021 06/16/20   Libby Maw, MD    Physical Exam: Vitals:   05/18/21 2130 05/18/21 2158 05/18/21 2258 05/18/21 2306  BP: 121/75 110/78 116/83 121/87  Pulse: (!) 110 (!) 104 (!) 102 96  Resp: 16 16 16 16   Temp:   98.1 F (36.7 C)   TempSrc:   Oral   SpO2: 98%  95% 96%      Constitutional: Depressed, fully awake, in TS Vitals:   05/18/21 2130  05/18/21 2158 05/18/21 2258 05/18/21 2306  BP: 121/75 110/78 116/83 121/87  Pulse: (!) 110 (!) 104 (!) 102 96  Resp: 16 16 16 16   Temp:   98.1 F (36.7 C)   TempSrc:   Oral   SpO2: 98%  95% 96%   Eyes: PERRL, lids and conjunctivae normal ENMT: Mucous membranes are moist. Posterior pharynx clear of any exudate or lesions.Normal dentition.  Neck: normal, supple, no masses, no thyromegaly Respiratory: c coarse breath sounds bilaterally with Rales bilaterally, no wheezing, no crackles. Normal respiratory effort. No accessory muscle use.  Cardiovascular: Sinus tachycardia, no murmurs / rubs / gallops. No extremity edema. 2+ pedal pulses. No carotid bruits.  Abdomen: no tenderness, no masses palpated. No hepatosplenomegaly. Bowel sounds positive.  Musculoskeletal: no clubbing / cyanosis. No joint deformity upper and lower extremities. Good ROM, no contractures. Normal muscle tone.  Skin: no rashes, lesions, ulcers. No induration Neurologic: CN 2-12 grossly intact. Sensation intact, DTR normal. Strength 5/5 in all 4.  Psychiatric: Normal judgment and insight. Alert and oriented x 3. Normal mood.     Labs on Admission: I have personally reviewed following labs and imaging studies  CBC: Recent Labs  Lab 05/18/21 1911  WBC 13.3*  NEUTROABS 11.3*  HGB 14.4  HCT 42.0  MCV 100.7*  PLT 544   Basic Metabolic Panel: Recent Labs  Lab 05/18/21 1911  NA 141  K 3.2*  CL 100  CO2 25  GLUCOSE 127*  BUN 11  CREATININE 1.13  CALCIUM 9.8   GFR: CrCl cannot be calculated (Unknown ideal weight.). Liver Function Tests: Recent Labs  Lab 05/18/21 1911  AST 316*  ALT 233*  ALKPHOS 101  BILITOT 0.5  PROT 8.1  ALBUMIN 4.6   No results for input(s): LIPASE, AMYLASE in the last 168 hours. No results for input(s): AMMONIA in the last 168 hours. Coagulation Profile: No results for input(s): INR, PROTIME in the last 168 hours. Cardiac Enzymes: No results for input(s): CKTOTAL, CKMB,  CKMBINDEX, TROPONINI in the last 168 hours. BNP (last 3 results) No results for input(s): PROBNP in the last 8760 hours. HbA1C: No results for input(s): HGBA1C in the last 72 hours. CBG: Recent Labs  Lab 05/18/21 1939  GLUCAP 127*   Lipid Profile: No results for input(s): CHOL, HDL, LDLCALC, TRIG, CHOLHDL, LDLDIRECT in the last 72 hours. Thyroid Function Tests: No results for input(s): TSH, T4TOTAL, FREET4, T3FREE, THYROIDAB in the last 72 hours. Anemia Panel: No results for input(s): VITAMINB12, FOLATE, FERRITIN, TIBC, IRON, RETICCTPCT in the last 72 hours. Urine analysis:    Component Value Date/Time   COLORURINE YELLOW 06/16/2020 1119   APPEARANCEUR [place] (A) 06/16/2020 1119   LABSPEC 1.015 06/16/2020 1119   PHURINE 8.5 (A) 06/16/2020 1119   GLUCOSEU NEGATIVE 06/16/2020 1119   HGBUR NEGATIVE 06/16/2020 1119  BILIRUBINUR NEGATIVE 06/16/2020 1119   KETONESUR NEGATIVE 06/16/2020 1119   PROTEINUR 30 (A) 03/25/2015 1129   UROBILINOGEN 0.2 06/16/2020 1119   NITRITE NEGATIVE 06/16/2020 1119   LEUKOCYTESUR NEGATIVE 06/16/2020 1119   Sepsis Labs: @LABRCNTIP (procalcitonin:4,lacticidven:4) )No results found for this or any previous visit (from the past 240 hour(s)).   Radiological Exams on Admission: DG Chest Port 1 View  Result Date: 05/18/2021 CLINICAL DATA:  Shortness of breath. Respiratory distress. Hypoxia. EXAM: PORTABLE CHEST 1 VIEW COMPARISON:  09/28/2014 FINDINGS: The cardiomediastinal contours are normal. The lungs are clear. Pulmonary vasculature is normal. No consolidation, pleural effusion, or pneumothorax. No acute osseous abnormalities are seen. IMPRESSION: Negative AP view of the chest. Electronically Signed   By: Keith Rake M.D.   On: 05/18/2021 20:25    EKG: Independently reviewed.  It shows sinus tachycardia with a rate of 135, repolarization as well as evidence of LVH by voltage criteria  Assessment/Plan Principal Problem:   Aspiration pneumonia  (HCC) Active Problems:   Hypertension   Anxiety   Ulcerative colitis ([place])   PTSD (post-traumatic stress disorder)   Tobacco use   History of substance abuse ([place])   Other hyperlipidemia   Hypokalemia     #1 acute hypoxia probably due to aspiration pneumonitis: Patient will be admitted for observation.  Placed on oxygen.  Supportive care.  Titrate off oxygen prior to discharge.  Empirically on ceftriaxone and Flagyl for possible aspiration pneumonia.  #2 altered mental status: Patient is now fully awake and alert.  He was found passed out in his car for prolonged period of time.  Suspected overdose of narcotics.  He responded to Narcan.  He has been on Suboxone apparently.  Urine drug screen pending.  He has previously tested for opiates benzos and THC years ago.  Patient has risk factors for CVA as well as seizure.  I will check MRI of the brain to rule out acute CVA.  Echocardiogram to rule out cardiac causes.  May require EEG also.  We will start with urine drug screen however.  #3 history of PTSD: Monitor and avoid all medications at this point.  #4 essential hypertension: Patient currently on Diovan.  We will continue his Diovan.  #5 GERD: Continue with PPIs  #6 hypokalemia: Probably related to vomiting.  Replete potassium  #7 hyperlipidemia: Not on any treatment at home.  We will check it out.  May treat if needed.   DVT prophylaxis: Lovenox Code Status: Full code Family Communication: Wife at bedside Disposition Plan: Home Consults called: None Admission status: Observation  Severity of Illness: The appropriate patient status for this patient is OBSERVATION. Observation status is judged to be reasonable and necessary in order to provide the required intensity of service to ensure the patient's safety. The patient's presenting symptoms, physical exam findings, and initial radiographic and laboratory data in the context of their medical condition is felt to place them at  decreased risk for further clinical deterioration. Furthermore, it is anticipated that the patient will be medically stable for discharge from the hospital within 2 midnights of admission. The following factors support the patient status of observation.   " The patient's presenting symptoms include hypoxia and passing out. " The physical exam findings include mild basal crackles. " The initial radiographic and laboratory data are low potassium.   Barbette Merino MD [facility] Pager [phone]  If 7PM-7AM, please contact night-coverage [email or web address] Password [facility]  05/18/2021, 11:18 PM

## 2021-05-18 NOTE — ED Provider Notes (Signed)
[facility] DEPT Provider Note   CSN: [id number] Arrival date & time: 05/18/21  1846     History Chief Complaint  Patient presents with   Drug Overdose    Todd Hanna is a 35 y.o. male.  35 year old male presents after being found in his car unresponsive.  Patient states that he uses Suboxone over the day that he buys on the street.  He also missed to drinking liquor today.  Denies this was a suicide attempt.  EMS found the patient with agonal respirations.  Patient's O2 sat was 20%.  His CBG was 145.  Gave Narcan with good relief.  Patient denies any substance abuse at this time      Past Medical History:  Diagnosis Date   Anxiety    Epididymitis, right 03/25/2015   History of stomach ulcers    Hypertension    Myocardial infarction [facility])    Nephrolithiasis    Pneumonia ~ 2011   Ulcerative colitis 2011   colonoscopy at [facility]    Patient Active Problem List   Diagnosis Date Noted   Other hyperlipidemia 06/26/2020   Transaminitis 06/26/2020   Healthcare maintenance 06/16/2020   Unilateral inguinal hernia without obstruction or gangrene 06/16/2020   History of myocardial infarction 06/16/2020   History of substance abuse ([place]) 03/17/2020   Hypertension 01/17/2020   Anxiety 01/17/2020   Urticaria 05/17/2017   Tobacco use 03/25/2015   PTSD (post-traumatic stress disorder) 07/07/2014   Ulcerative colitis     Past Surgical History:  Procedure Laterality Date   COLONOSCOPY  2011   dx'd ulcerative colitis   LITHOTRIPSY  2011       Family History  Problem Relation Age of Onset   Hypertension Mother     Social History   Tobacco Use   Smoking status: Every Day    Packs/day: 0.60    Years: 13.00    Pack years: 7.80    Types: Cigarettes   Smokeless tobacco: Never  Vaping Use   Vaping Use: Never used  Substance Use Topics   Alcohol use: Yes    Alcohol/week: 4.0 standard drinks    Types: 4 Cans of beer per week   Drug use: No     Home Medications Prior to Admission medications   Medication Sig Start Date End Date Taking? Authorizing Provider  acetaminophen (TYLENOL) 500 MG tablet Take 500-1,000 mg by mouth every 6 (six) hours as needed for mild pain or headache.   Yes [provider]  omeprazole (PRILOSEC OTC) 20 MG tablet Take 20 mg by mouth daily as needed (for heartburn).   Yes [provider]  valsartan (DIOVAN) 80 MG tablet Take 1 tablet by mouth once daily Patient taking differently: Take 80 mg by mouth daily. 04/14/21  Yes Libby Maw, MD  cetirizine (ZYRTEC ALLERGY) 10 MG tablet Take 1 tablet (10 mg total) by mouth 2 (two) times daily. As needed for hives. Patient not taking: Reported on 05/18/2021 06/16/20   Libby Maw, MD  cloNIDine (CATAPRES - DOSED IN MG/24 HR) 0.1 mg/24hr patch Place 1 patch (0.1 mg total) onto the skin once a week. Patient not taking: Reported on 05/18/2021 06/16/20   Libby Maw, MD    Allergies    Amoxicillin and Penicillins  Review of Systems   Review of Systems  All other systems reviewed and are negative.  Physical Exam Updated Vital Signs BP 116/74   Pulse (!) 127   Temp 98.1 F (36.7  C) (Oral)   Resp 12   SpO2 90%   Physical Exam Vitals and nursing note reviewed.  Constitutional:      General: He is not in acute distress.    Appearance: Normal appearance. He is well-developed. He is not toxic-appearing.  HENT:     Head: Normocephalic and atraumatic.  Eyes:     General: Lids are normal.     Conjunctiva/sclera: Conjunctivae normal.     Pupils: Pupils are equal, round, and reactive to light.  Neck:     Thyroid: No thyroid mass.     Trachea: No tracheal deviation.  Cardiovascular:     Rate and Rhythm: Normal rate and regular rhythm.     Heart sounds: Normal heart sounds. No murmur heard.   No gallop.  Pulmonary:     Effort: Pulmonary effort is normal. Tachypnea present. No respiratory distress.     Breath  sounds: No stridor. Decreased breath sounds present. No wheezing, rhonchi or rales.  Abdominal:     General: There is no distension.     Palpations: Abdomen is soft.     Tenderness: There is no abdominal tenderness. There is no rebound.  Musculoskeletal:        General: No tenderness. Normal range of motion.     Cervical back: Normal range of motion and neck supple.  Skin:    General: Skin is warm and dry.     Findings: No abrasion or rash.  Neurological:     Mental Status: He is alert and oriented to person, place, and time. Mental status is at baseline.     GCS: GCS eye subscore is 4. GCS verbal subscore is 5. GCS motor subscore is 6.     Cranial Nerves: Cranial nerves are intact. No cranial nerve deficit.     Sensory: No sensory deficit.     Motor: Motor function is intact.  Psychiatric:        Attention and Perception: Attention normal.        Speech: Speech normal.        Behavior: Behavior normal.    ED Results / Procedures / Treatments   Labs (all labs ordered are listed, but only abnormal results are displayed) Labs Reviewed  COMPREHENSIVE METABOLIC PANEL  SALICYLATE LEVEL  ACETAMINOPHEN LEVEL  ETHANOL  RAPID URINE DRUG SCREEN, HOSP PERFORMED  CBC WITH DIFFERENTIAL/PLATELET  URINALYSIS, ROUTINE W REFLEX MICROSCOPIC  LACTIC ACID, PLASMA  LACTIC ACID, PLASMA  CBG MONITORING, ED    EKG None  Radiology No results found.  Procedures Procedures   Medications Ordered in ED Medications - No data to display  ED Course  I have reviewed the triage vital signs and the nursing notes.  Pertinent labs & imaging results that were available during my care of the patient were reviewed by me and considered in my medical decision making (see chart for details).    MDM Rules/Calculators/A&P                           Patient was found in his car after being unresponsive for maybe 5 hours responded to Narcan.  Patient was tachycardic and hypoxic on arrival here.  He was  that way before coming here.  Chest x-ray does not have any acute findings.  However concern for patient having aspiration pneumonia given his ongoing hypoxemia.  Mild elevation of his transaminases.  Started on IV antibiotics and will admit for observation Final Clinical Impression(s) /  ED Diagnoses Final diagnoses:  None    Rx / DC Orders ED Discharge Orders     None        Lacretia Leigh, MD 05/18/21 2132

## 2021-05-18 NOTE — ED Notes (Addendum)
Patient's O2 Sat decreased to 90% without O2 on. Placed back on 1L/[place] Patient is having difficulty remembering precipitating events. Patient is repeating questions that have already been answered multiple times.

## 2021-05-18 NOTE — ED Triage Notes (Signed)
Pt BIB EMS from street. Bystanders reports pt was unresponsive in his car x 5 hours. Upon EMS arrival, pt in respiratory distress - O2sat was 28% on RA. 4 mg narcan given. Pt now a&o x 4 and able to maintain airway. 18G LAC. 110 HR, BP 100/60, CBG 145, RR 28, ETCO2 41, o2 100% NRB. Pt denies drug use.

## 2021-05-18 NOTE — ED Notes (Signed)
Patients wife would like a call back with an update Juleen China [phone]

## 2021-05-19 LAB — RAPID URINE DRUG SCREEN, HOSP PERFORMED
Amphetamines: NOT DETECTED
Barbiturates: NOT DETECTED
Benzodiazepines: POSITIVE — AB
Cocaine: NOT DETECTED
Opiates: NOT DETECTED
Tetrahydrocannabinol: POSITIVE — AB

## 2021-05-19 LAB — SARS CORONAVIRUS 2 (TAT 6-24 HRS): SARS Coronavirus 2: NEGATIVE

## 2021-05-19 MED ORDER — ONDANSETRON HCL 4 MG PO TABS: 4.0000 mg | ORAL_TABLET | Freq: Four times a day (QID) | ORAL | Status: DC | PRN

## 2021-05-19 MED ORDER — ENOXAPARIN SODIUM 40 MG/0.4ML IJ SOSY: 40.0000 mg | PREFILLED_SYRINGE | INTRAMUSCULAR | Status: DC

## 2021-05-19 MED ORDER — OMEPRAZOLE MAGNESIUM 20 MG PO TBEC: 20.0000 mg | DELAYED_RELEASE_TABLET | Freq: Every day | ORAL | Status: DC | PRN

## 2021-05-19 MED ORDER — DEXTROSE-NACL 5-0.9 % IV SOLN: INTRAVENOUS | Status: DC

## 2021-05-19 MED ORDER — ONDANSETRON HCL 4 MG/2ML IJ SOLN: 4.0000 mg | Freq: Four times a day (QID) | INTRAMUSCULAR | Status: DC | PRN

## 2021-05-19 MED ORDER — DOXYCYCLINE HYCLATE 100 MG PO CAPS: 100.0000 mg | ORAL_CAPSULE | Freq: Two times a day (BID) | ORAL | 0 refills | Status: DC

## 2021-05-19 MED ORDER — PANTOPRAZOLE SODIUM 40 MG PO TBEC: 40.0000 mg | DELAYED_RELEASE_TABLET | Freq: Every day | ORAL | Status: DC | PRN

## 2021-05-19 MED ORDER — IRBESARTAN 300 MG PO TABS: 300.0000 mg | ORAL_TABLET | Freq: Every day | ORAL | Status: DC

## 2021-05-19 NOTE — Discharge Instructions (Addendum)
You are leaving the hospital [place].  We discussed the risks of leaving, including worsening of condition and death.  You can return to the emergency department at anytime for continued care.

## 2021-05-19 NOTE — ED Notes (Signed)
Patient in room with monitors removed stating he wants to go home instead of being admitted. He states he feels fine now. Admitting MD Jonelle Sidle notified

## 2021-05-19 NOTE — ED Notes (Signed)
Patient left AMA. Dr.Bero spoke with patient before leaving. Dr.Garba contacted multiple times with no response

## 2021-05-27 ENCOUNTER — Other Ambulatory Visit: Payer: Self-pay

## 2021-05-27 DIAGNOSIS — I10 Essential (primary) hypertension: Secondary | ICD-10-CM

## 2021-05-27 MED ORDER — VALSARTAN 80 MG PO TABS: 80.0000 mg | ORAL_TABLET | Freq: Every day | ORAL | 0 refills | Status: DC

## 2021-06-02 NOTE — Telephone Encounter (Signed)
Spoke to patient and he will call back to schedule an appointment. Dm/cma

## 2021-06-26 ENCOUNTER — Other Ambulatory Visit: Payer: Self-pay | Admitting: Family Medicine

## 2021-06-26 DIAGNOSIS — I10 Essential (primary) hypertension: Secondary | ICD-10-CM

## 2021-07-02 NOTE — Telephone Encounter (Signed)
Lft VM to rtn call to schedule an appointment

## 2021-07-11 ENCOUNTER — Other Ambulatory Visit: Payer: Self-pay | Admitting: Family Medicine

## 2021-07-11 DIAGNOSIS — I10 Essential (primary) hypertension: Secondary | ICD-10-CM

## 2021-07-15 NOTE — Telephone Encounter (Signed)
Patient notified VIA phone and will call back to schedule an appointment before her runs out of medication.  Todd Hanna

## 2021-08-13 ENCOUNTER — Other Ambulatory Visit: Payer: Self-pay | Admitting: Family Medicine

## 2021-08-13 DIAGNOSIS — I10 Essential (primary) hypertension: Secondary | ICD-10-CM

## 2021-08-18 MED ORDER — CLONIDINE 0.1 MG/24HR TD PTWK: 0.1000 mg | MEDICATED_PATCH | TRANSDERMAL | 0 refills | Status: DC

## 2021-08-18 NOTE — Telephone Encounter (Signed)
Chart supports rx refill Last ov: 10/07/2020 Last refill: 06/28/2021 FOV: 09/30/2021

## 2021-09-20 ENCOUNTER — Other Ambulatory Visit: Payer: Self-pay | Admitting: Family Medicine

## 2021-09-20 DIAGNOSIS — I10 Essential (primary) hypertension: Secondary | ICD-10-CM

## 2021-09-20 MED ORDER — VALSARTAN 80 MG PO TABS
80.0000 mg | ORAL_TABLET | Freq: Every day | ORAL | 0 refills | Status: DC
Start: 2021-09-20 — End: 2021-10-18

## 2021-09-24 ENCOUNTER — Ambulatory Visit: Payer: 59 | Admitting: Family Medicine

## 2021-09-30 ENCOUNTER — Ambulatory Visit: Payer: 59 | Admitting: Family Medicine

## 2021-10-18 ENCOUNTER — Ambulatory Visit: Payer: 59 | Admitting: Family Medicine

## 2021-10-18 ENCOUNTER — Other Ambulatory Visit: Payer: Self-pay

## 2021-10-18 ENCOUNTER — Encounter: Payer: Self-pay | Admitting: Family Medicine

## 2021-10-18 VITALS — BP 156/90 | HR 102 | Temp 97.6°F | Ht 67.0 in | Wt 118.6 lb

## 2021-10-18 DIAGNOSIS — K51919 Ulcerative colitis, unspecified with unspecified complications: Secondary | ICD-10-CM

## 2021-10-18 DIAGNOSIS — I10 Essential (primary) hypertension: Secondary | ICD-10-CM

## 2021-10-18 DIAGNOSIS — R7989 Other specified abnormal findings of blood chemistry: Secondary | ICD-10-CM

## 2021-10-18 DIAGNOSIS — Z23 Encounter for immunization: Secondary | ICD-10-CM

## 2021-10-18 LAB — URINALYSIS, ROUTINE W REFLEX MICROSCOPIC
Bilirubin Urine: NEGATIVE
Ketones, ur: NEGATIVE
Leukocytes,Ua: NEGATIVE
Nitrite: NEGATIVE
Specific Gravity, Urine: 1.015 (ref 1.000–1.030)
Total Protein, Urine: 30 — AB
Urine Glucose: NEGATIVE
Urobilinogen, UA: 1 (ref 0.0–1.0)
pH: 7 (ref 5.0–8.0)

## 2021-10-18 LAB — CBC
HCT: 41.3 % (ref 39.0–52.0)
Hemoglobin: 13.9 g/dL (ref 13.0–17.0)
MCHC: 33.7 g/dL (ref 30.0–36.0)
MCV: 101.6 fl — ABNORMAL HIGH (ref 78.0–100.0)
Platelets: 252 10*3/uL (ref 150.0–400.0)
RBC: 4.07 Mil/uL — ABNORMAL LOW (ref 4.22–5.81)
RDW: 13.6 % (ref 11.5–15.5)
WBC: 5 10*3/uL (ref 4.0–10.5)

## 2021-10-18 LAB — COMPREHENSIVE METABOLIC PANEL
ALT: 375 U/L — ABNORMAL HIGH (ref 0–53)
AST: 469 U/L — ABNORMAL HIGH (ref 0–37)
Albumin: 4.9 g/dL (ref 3.5–5.2)
Alkaline Phosphatase: 76 U/L (ref 39–117)
BUN: 6 mg/dL (ref 6–23)
CO2: 28 mEq/L (ref 19–32)
Calcium: 9.7 mg/dL (ref 8.4–10.5)
Chloride: 99 mEq/L (ref 96–112)
Creatinine, Ser: 0.63 mg/dL (ref 0.40–1.50)
GFR: 122.76 mL/min (ref >60.00)
Glucose, Bld: 94 mg/dL (ref 70–99)
Potassium: 3.6 mEq/L (ref 3.5–5.1)
Sodium: 137 mEq/L (ref 135–145)
Total Bilirubin: 0.6 mg/dL (ref 0.2–1.2)
Total Protein: 7.8 g/dL (ref 6.0–8.3)

## 2021-10-18 MED ORDER — HYOSCYAMINE SULFATE 0.125 MG PO TABS: 0.1250 mg | ORAL_TABLET | ORAL | 0 refills | Status: DC | PRN

## 2021-10-18 MED ORDER — MESALAMINE ER 500 MG PO CPCR: 500.0000 mg | ORAL_CAPSULE | Freq: Four times a day (QID) | ORAL | 1 refills | Status: AC

## 2021-10-18 MED ORDER — VALSARTAN 160 MG PO TABS: 160.0000 mg | ORAL_TABLET | Freq: Every day | ORAL | 3 refills | Status: DC

## 2021-10-18 NOTE — Progress Notes (Addendum)
Established Patient Office Visit  Subjective:  Patient ID: Todd Hanna, male    DOB: 1986/05/03  Age: 36 y.o. MRN: [id number]  CC:  Chief Complaint  Patient presents with   Follow-up    Follow up on BP, states that he feels like ulcerative colitis is flaring up x 1 month every day having major cramping.     HPI Todd Hanna presents for follow-up of hypertension.  Continues valsartan for his blood pressure but has been unable to afford the clonidine patch.  Has not been checking his pressures denies headaches dizziness.  Believes that his ulcerative colitis may be returning.  For the last month or so he has been experiencing intense abdominal cramping located in his lower stomach area.  Relieved when he stools.  Stools have been formed and otherwise normal.  There is occasional blood.  Denies weight loss but says that his weight goes up and down.  Diagnosed with UC in 2011.  Has been lost to follow-up since that time.  UC it seems to have been quiescent until this last month.  Cramps seem to bother him the most.  Seen in the ER back in August for an opiate overdose with Suboxone.  Past Medical History:  Diagnosis Date   Anxiety    Epididymitis, right 03/25/2015   History of stomach ulcers    Hypertension    Myocardial infarction [facility])    Nephrolithiasis    Pneumonia ~ 2011   Ulcerative colitis 2011   colonoscopy at [facility]    Past Surgical History:  Procedure Laterality Date   COLONOSCOPY  2011   dx'd ulcerative colitis   LITHOTRIPSY  2011    Family History  Problem Relation Age of Onset   Hypertension Mother     Social History   Socioeconomic History   Marital status: Single    Spouse name: Not on file   Number of children: 1   Years of education: Not on file   Highest education level: Not on file  Occupational History   Occupation: Psychologist, sport and exercise: [employer]  Tobacco Use   Smoking status: Every Day    Packs/day: 0.60    Years: 13.00    Pack years:  7.80    Types: Cigarettes   Smokeless tobacco: Never  Vaping Use   Vaping Use: Never used  Substance and Sexual Activity   Alcohol use: Yes    Alcohol/week: 4.0 standard drinks    Types: 4 Cans of beer per week   Drug use: No   Sexual activity: Yes  Other Topics Concern   Not on file  Social History Narrative   Not on file   Social Determinants of Health   Financial Resource Strain: Not on file  Food Insecurity: Not on file  Transportation Needs: Not on file  Physical Activity: Not on file  Stress: Not on file  Social Connections: Not on file  Intimate Partner Violence: Not on file    Outpatient Medications Prior to Visit  Medication Sig Dispense Refill   acetaminophen (TYLENOL) 500 MG tablet Take 500-1,000 mg by mouth every 6 (six) hours as needed for mild pain or headache.     valsartan (DIOVAN) 80 MG tablet Take 1 tablet (80 mg total) by mouth daily. 30 tablet 0   cetirizine (ZYRTEC ALLERGY) 10 MG tablet Take 1 tablet (10 mg total) by mouth 2 (two) times daily. As needed for hives. (Patient not taking: Reported on 05/18/2021) 60 tablet 0  cloNIDine (CATAPRES - DOSED IN MG/24 HR) 0.1 mg/24hr patch Place 1 patch (0.1 mg total) onto the skin once a week. (Patient not taking: Reported on 10/18/2021) 4 patch 0   doxycycline (VIBRAMYCIN) 100 MG capsule Take 1 capsule (100 mg total) by mouth 2 (two) times daily. 20 capsule 0   omeprazole (PRILOSEC OTC) 20 MG tablet Take 20 mg by mouth daily as needed (for heartburn). (Patient not taking: Reported on 10/18/2021)     No facility-administered medications prior to visit.    Allergies  Allergen Reactions   Amoxicillin Hives   Penicillins Hives    ROS Review of Systems  Constitutional:  Negative for chills, diaphoresis, fatigue, fever and unexpected weight change.  HENT: Negative.    Respiratory: Negative.    Cardiovascular: Negative.   Gastrointestinal:  Positive for abdominal pain and blood in stool. Negative for anal  bleeding, constipation, diarrhea, nausea and vomiting.  Genitourinary: Negative.   Musculoskeletal:  Negative for gait problem and joint swelling.  Neurological:  Negative for dizziness, weakness, light-headedness and headaches.  Psychiatric/Behavioral: Negative.       Objective:    Physical Exam Vitals and nursing note reviewed.  Constitutional:      General: He is not in acute distress.    Appearance: Normal appearance. He is normal weight. He is not ill-appearing or toxic-appearing.  HENT:     Head: Normocephalic and atraumatic.     Right Ear: External ear normal.     Left Ear: External ear normal.     Mouth/Throat:     Mouth: Mucous membranes are moist.     Pharynx: Oropharynx is clear. No oropharyngeal exudate or posterior oropharyngeal erythema.  Eyes:     General: No scleral icterus.       Right eye: No discharge.        Left eye: No discharge.     Extraocular Movements: Extraocular movements intact.     Conjunctiva/sclera: Conjunctivae normal.     Pupils: Pupils are equal, round, and reactive to light.  Cardiovascular:     Rate and Rhythm: Normal rate and regular rhythm.  Pulmonary:     Effort: Pulmonary effort is normal.     Breath sounds: Normal breath sounds.  Abdominal:     General: Abdomen is flat. Bowel sounds are normal. There is no distension.     Palpations: Abdomen is soft. There is no mass.     Tenderness: There is no abdominal tenderness. There is no guarding or rebound.     Hernia: No hernia is present.  Musculoskeletal:     Cervical back: No rigidity or tenderness.     Right lower leg: No edema.     Left lower leg: No edema.  Lymphadenopathy:     Cervical: No cervical adenopathy.  Skin:    General: Skin is warm and dry.  Neurological:     Mental Status: He is alert and oriented to person, place, and time.  Psychiatric:        Mood and Affect: Mood normal.        Behavior: Behavior normal.    BP (!) 156/90 (BP Location: Right Arm, Patient  Position: Sitting, Cuff Size: Normal)    Pulse (!) 102    Temp 97.6 F (36.4 C) (Temporal)    Ht 5\' 7"  (1.702 m)    Wt 118 lb 9.6 oz (53.8 kg)    SpO2 96%    BMI 18.58 kg/m  Wt Readings from Last 3 Encounters:  10/18/21  118 lb 9.6 oz (53.8 kg)  10/07/20 118 lb (53.5 kg)  06/26/20 118 lb 12.8 oz (53.9 kg)     Health Maintenance Due  Topic Date Due   Pneumococcal Vaccine 14-15 Years old (1 - PCV) Never done    There are no preventive care reminders to display for this patient.  Lab Results  Component Value Date   TSH 2.51 06/16/2020   Lab Results  Component Value Date   WBC 5.0 10/18/2021   HGB 13.9 10/18/2021   HCT 41.3 10/18/2021   MCV 101.6 (H) 10/18/2021   PLT 252.0 10/18/2021   Lab Results  Component Value Date   NA 137 10/18/2021   K 3.6 10/18/2021   CO2 28 10/18/2021   GLUCOSE 94 10/18/2021   BUN 6 10/18/2021   CREATININE 0.63 10/18/2021   BILITOT 0.6 10/18/2021   ALKPHOS 76 10/18/2021   AST 469 (H) 10/18/2021   ALT 375 (H) 10/18/2021   PROT 7.8 10/18/2021   ALBUMIN 4.9 10/18/2021   CALCIUM 9.7 10/18/2021   ANIONGAP 16 (H) 05/18/2021   GFR 122.76 10/18/2021   Lab Results  Component Value Date   CHOL 220 (H) 06/16/2020   Lab Results  Component Value Date   HDL 102.90 06/16/2020   No results found for: [facility] Lab Results  Component Value Date   TRIG 328.0 (H) 06/16/2020   Lab Results  Component Value Date   CHOLHDL 2 06/16/2020   Lab Results  Component Value Date   HGBA1C 5.4 06/16/2020      Assessment & Plan:   Problem List Items Addressed This Visit       Cardiovascular and Mediastinum   Essential hypertension - Primary   Relevant Medications   valsartan (DIOVAN) 160 MG tablet   Other Relevant Orders   CBC (Completed)   Comprehensive metabolic panel (Completed)   Urinalysis, Routine w reflex microscopic (Completed)     Digestive   Ulcerative colitis (HCC)   Relevant Medications   mesalamine (PENTASA) 500 MG CR capsule    hyoscyamine (LEVSIN) 0.125 MG tablet   Other Relevant Orders   Ambulatory referral to Gastroenterology   CBC (Completed)   Comprehensive metabolic panel (Completed)   Other Visit Diagnoses     Needs flu shot       Relevant Orders   Flu vaccine HIGH DOSE PF (Fluzone High dose) (Completed)   Elevated LFTs       Relevant Orders   Hepatitis B Surface AntiGEN   Hepatitis C antibody       Meds ordered this encounter  Medications   mesalamine (PENTASA) 500 MG CR capsule    Sig: Take 1 capsule (500 mg total) by mouth 4 (four) times daily.    Dispense:  120 capsule    Refill:  1   valsartan (DIOVAN) 160 MG tablet    Sig: Take 1 tablet (160 mg total) by mouth daily.    Dispense:  90 tablet    Refill:  3   hyoscyamine (LEVSIN) 0.125 MG tablet    Sig: Take 1 tablet (0.125 mg total) by mouth every 4 (four) hours as needed.    Dispense:  30 tablet    Refill:  0    Follow-up: Return in about 3 months (around 01/16/2022), or Please stop smoking..  Have increased valsartan to 160 mg.  Discontinue clonidine.  We will start dentes and Levsin for as needed cramping.  Referral to GI for follow-up.  Mliss Sax, MD

## 2021-10-18 NOTE — Addendum Note (Signed)
Addended by: Andrez Grime on: 10/18/2021 06:03 PM   Modules accepted: Orders

## 2021-10-19 ENCOUNTER — Telehealth: Payer: Self-pay

## 2021-10-19 NOTE — Telephone Encounter (Signed)
Called patient went over labs atient aware of lab results states that he had this same situation last year with addition labs and virtual with a U/S of his liver. Patient states that he feels like it is his alcohol drinking that is causing the elevated liver enzymes and he's slowly working on this. Patient would ike to know if there was anything that you recommend to help to stop drinking. He would not like to schedule virtual if he will be having Hepatis panel or another U/S. Please advise

## 2021-10-19 NOTE — Telephone Encounter (Signed)
Spoke with patient per patient he would like to work on alcohol use by himself and give [place] a call back.

## 2021-11-08 ENCOUNTER — Other Ambulatory Visit: Payer: Self-pay | Admitting: Family Medicine

## 2021-11-08 DIAGNOSIS — K51919 Ulcerative colitis, unspecified with unspecified complications: Secondary | ICD-10-CM

## 2022-06-27 ENCOUNTER — Other Ambulatory Visit: Payer: Self-pay

## 2022-06-27 DIAGNOSIS — I10 Essential (primary) hypertension: Secondary | ICD-10-CM

## 2022-06-27 MED ORDER — VALSARTAN 160 MG PO TABS: 160.0000 mg | ORAL_TABLET | Freq: Every day | ORAL | 3 refills | Status: AC

## 2022-11-03 DEATH — deceased
